# Patient Record
Sex: Female | Born: 1960 | Race: White | Hispanic: No | Marital: Married | Smoking: Former smoker
Health system: Southern US, Community
[De-identification: ages and names within clinical notes are randomized; demographics above are authoritative.]

## PROBLEM LIST (undated history)

## (undated) DIAGNOSIS — F32A Depression, unspecified: Secondary | ICD-10-CM

## (undated) DIAGNOSIS — R011 Cardiac murmur, unspecified: Secondary | ICD-10-CM

## (undated) DIAGNOSIS — F329 Major depressive disorder, single episode, unspecified: Secondary | ICD-10-CM

## (undated) DIAGNOSIS — C439 Malignant melanoma of skin, unspecified: Secondary | ICD-10-CM

## (undated) DIAGNOSIS — R7989 Other specified abnormal findings of blood chemistry: Secondary | ICD-10-CM

## (undated) DIAGNOSIS — C44711 Basal cell carcinoma of skin of unspecified lower limb, including hip: Secondary | ICD-10-CM

## (undated) DIAGNOSIS — K219 Gastro-esophageal reflux disease without esophagitis: Secondary | ICD-10-CM

## (undated) DIAGNOSIS — I10 Essential (primary) hypertension: Secondary | ICD-10-CM

## (undated) HISTORY — DX: Malignant melanoma of skin, unspecified: C43.9

## (undated) HISTORY — DX: Other specified abnormal findings of blood chemistry: R79.89

## (undated) HISTORY — DX: Gastro-esophageal reflux disease without esophagitis: K21.9

## (undated) HISTORY — DX: Depression, unspecified: F32.A

## (undated) HISTORY — PX: SKIN CANCER EXCISION: SHX779

## (undated) HISTORY — DX: Basal cell carcinoma of skin of unspecified lower limb, including hip: C44.711

## (undated) HISTORY — PX: FOOT SURGERY: SHX648

## (undated) HISTORY — DX: Major depressive disorder, single episode, unspecified: F32.9

## (undated) HISTORY — DX: Cardiac murmur, unspecified: R01.1

## (undated) HISTORY — PX: TONSILLECTOMY: SUR1361

## (undated) HISTORY — DX: Essential (primary) hypertension: I10

---

## 1994-09-27 DIAGNOSIS — C439 Malignant melanoma of skin, unspecified: Secondary | ICD-10-CM

## 1994-09-27 HISTORY — DX: Malignant melanoma of skin, unspecified: C43.9

## 2005-09-27 HISTORY — PX: ABDOMINAL HYSTERECTOMY: SHX81

## 2008-09-06 LAB — LIPID PANEL
Cholesterol: 236 mg/dL — AB (ref 0–200)
HDL: 58 mg/dL (ref 35–70)
LDL Cholesterol: 160 mg/dL
Triglycerides: 88 mg/dL (ref 40–160)

## 2008-09-06 LAB — TSH: TSH: 4.21 u[IU]/mL (ref 0.41–5.90)

## 2008-09-27 LAB — HM PAP SMEAR: HM Pap smear: NORMAL

## 2010-06-08 ENCOUNTER — Encounter
Admission: RE | Admit: 2010-06-08 | Discharge: 2010-06-08 | Payer: Self-pay | Source: Home / Self Care | Admitting: Pediatrics

## 2010-10-27 ENCOUNTER — Encounter (HOSPITAL_COMMUNITY)
Admission: RE | Admit: 2010-10-27 | Discharge: 2010-10-27 | Payer: Self-pay | Source: Home / Self Care | Attending: Podiatry | Admitting: Podiatry

## 2011-05-07 ENCOUNTER — Other Ambulatory Visit: Payer: Self-pay | Admitting: Pediatrics

## 2011-05-07 DIAGNOSIS — Z1231 Encounter for screening mammogram for malignant neoplasm of breast: Secondary | ICD-10-CM

## 2011-06-03 ENCOUNTER — Telehealth: Payer: Self-pay | Admitting: *Deleted

## 2011-06-03 DIAGNOSIS — Z0000 Encounter for general adult medical examination without abnormal findings: Secondary | ICD-10-CM

## 2011-06-03 NOTE — Telephone Encounter (Signed)
Received staff msg pt coming in for CPX 09/07/11. Need labs entered in computer. Entered orders in EPIC...06/03/11@2 :31pm/LMB

## 2011-06-15 ENCOUNTER — Ambulatory Visit: Payer: Self-pay

## 2011-07-02 ENCOUNTER — Ambulatory Visit: Payer: Self-pay

## 2011-09-03 ENCOUNTER — Other Ambulatory Visit (INDEPENDENT_AMBULATORY_CARE_PROVIDER_SITE_OTHER): Payer: BC Managed Care – PPO

## 2011-09-03 ENCOUNTER — Other Ambulatory Visit: Payer: Self-pay | Admitting: Internal Medicine

## 2011-09-03 DIAGNOSIS — Z0000 Encounter for general adult medical examination without abnormal findings: Secondary | ICD-10-CM

## 2011-09-03 LAB — URINALYSIS, ROUTINE W REFLEX MICROSCOPIC
Bilirubin Urine: NEGATIVE
Ketones, ur: NEGATIVE
Leukocytes, UA: NEGATIVE
Nitrite: NEGATIVE
Specific Gravity, Urine: <=1.005 (ref 1.000–1.030)
Total Protein, Urine: NEGATIVE
pH: 7 (ref 5.0–8.0)

## 2011-09-03 LAB — CBC WITH DIFFERENTIAL/PLATELET
Eosinophils Relative: 2.6 % (ref 0.0–5.0)
HCT: 41 % (ref 36.0–46.0)
Hemoglobin: 14.3 g/dL (ref 12.0–15.0)
Lymphocytes Relative: 24 % (ref 12.0–46.0)
Lymphs Abs: 1.3 10*3/uL (ref 0.7–4.0)
Monocytes Relative: 9.9 % (ref 3.0–12.0)
Neutro Abs: 3.3 10*3/uL (ref 1.4–7.7)
RBC: 4.34 Mil/uL (ref 3.87–5.11)
WBC: 5.3 10*3/uL (ref 4.5–10.5)

## 2011-09-03 LAB — BASIC METABOLIC PANEL
Calcium: 8.9 mg/dL (ref 8.4–10.5)
GFR: 68.89 mL/min (ref >60.00)
Glucose, Bld: 89 mg/dL (ref 70–99)
Potassium: 4 mEq/L (ref 3.5–5.1)
Sodium: 139 mEq/L (ref 135–145)

## 2011-09-03 LAB — HEPATIC FUNCTION PANEL
AST: 31 U/L (ref 0–37)
Albumin: 4.2 g/dL (ref 3.5–5.2)
Alkaline Phosphatase: 44 U/L (ref 39–117)
Bilirubin, Direct: 0.1 mg/dL (ref 0.0–0.3)

## 2011-09-03 LAB — LIPID PANEL
Total CHOL/HDL Ratio: 4
VLDL: 16.4 mg/dL (ref 0.0–40.0)

## 2011-09-03 LAB — TSH: TSH: 2.94 u[IU]/mL (ref 0.35–5.50)

## 2011-09-07 ENCOUNTER — Encounter: Payer: Self-pay | Admitting: Internal Medicine

## 2011-09-07 ENCOUNTER — Ambulatory Visit (INDEPENDENT_AMBULATORY_CARE_PROVIDER_SITE_OTHER): Payer: BC Managed Care – PPO | Admitting: Internal Medicine

## 2011-09-07 VITALS — BP 120/82 | HR 60 | Temp 98.6°F | Ht 69.5 in | Wt 208.6 lb

## 2011-09-07 DIAGNOSIS — Z0000 Encounter for general adult medical examination without abnormal findings: Secondary | ICD-10-CM

## 2011-09-07 DIAGNOSIS — Z1239 Encounter for other screening for malignant neoplasm of breast: Secondary | ICD-10-CM

## 2011-09-07 DIAGNOSIS — I10 Essential (primary) hypertension: Secondary | ICD-10-CM | POA: Insufficient documentation

## 2011-09-07 DIAGNOSIS — C439 Malignant melanoma of skin, unspecified: Secondary | ICD-10-CM | POA: Insufficient documentation

## 2011-09-07 DIAGNOSIS — F32A Depression, unspecified: Secondary | ICD-10-CM | POA: Insufficient documentation

## 2011-09-07 DIAGNOSIS — Z124 Encounter for screening for malignant neoplasm of cervix: Secondary | ICD-10-CM

## 2011-09-07 DIAGNOSIS — F3289 Other specified depressive episodes: Secondary | ICD-10-CM

## 2011-09-07 DIAGNOSIS — F329 Major depressive disorder, single episode, unspecified: Secondary | ICD-10-CM

## 2011-09-07 DIAGNOSIS — M542 Cervicalgia: Secondary | ICD-10-CM

## 2011-09-07 MED ORDER — HYDROCHLOROTHIAZIDE 25 MG PO TABS: 25.0000 mg | ORAL_TABLET | Freq: Every day | ORAL | Status: DC

## 2011-09-07 MED ORDER — DULOXETINE HCL 30 MG PO CPEP: 30.0000 mg | ORAL_CAPSULE | Freq: Every day | ORAL | Status: DC

## 2011-09-07 NOTE — Patient Instructions (Signed)
It was good to see you today. Health Maintenance reviewed - mammogram and gynecology referrals done; immunizations up to date we'll make referral to mammogram, gynecology, and to physical therapy for your neck/left shoulder. Our office will contact you regarding appointment(s) once made. Medications reviewed, no changes at this time. Refill on medication(s) as discussed today. Work on lifestyle changes as discussed (low fat, low carb, increased protein diet; improved exercise efforts; weight loss) to control sugar, blood pressure and cholesterol levels and/or reduce risk of developing other medical problems. Look into [email or web address] or other type of food journal to assist you in this process. Please schedule followup in 3-4 months to review depression medications and med plans - also weight check, call sooner if problems.

## 2011-09-07 NOTE — Progress Notes (Signed)
Subjective:    Patient ID: Caroline Wall, female    DOB: March 15, 1961, 50 y.o.   MRN: [id number]  HPI New patient to me and our practice, here today to establish care Also here for annual physical. Overall feels well  Also reviewed chronic medical issues today: Depression. On various medical treatments for same over past 10 years. Initial episode precipitated by "bad breakup" -has tried sertraline and Wellbutrin in past with ineffective results. On Cymbalta since 2009 with good control but would like to consider weaning off same. Not currently in counseling but has completed work on same in past. Denies SI/HI or current symptoms of insomnia, fatigue or depression  Melanoma. Personal history of same in 1996 located on left low back. Status post wide excision with no evidence of recurrence. Follows annually at [facility] for a dermatology review and body mapping. Follows with Dr. Maisie Fus for same  Hypertension. On diuretic therapy for same for years. the patient reports compliance with medication(s) as prescribed. Denies adverse side effects.  complains of left neck and shoulder pain - Onset 2-1/2 weeks ago -  precipitated by "sleeping wrong", no injury.  Pain some improved with ibuprofen and massage.  Not associated with numbness, weakness or balance problems   Past Medical History  Diagnosis Date  . Depression     prior tx failure with sertraline and wellbutrin  . Hypertension   . Melanoma 1996    L low back - followxs annually for body scan at [facility]   Family History  Problem Relation Age of Onset  . Endometrial cancer Mother 46  . Hypertension Father   . Diabetes Paternal Grandmother   . Heart disease Paternal Grandmother 22   History  Substance Use Topics  . Smoking status: Former Games developer  . Smokeless tobacco: Not on file   Comment: Lives with domestic partner and their 2 adopted children  . Alcohol Use: Yes    Review of Systems Constitutional: Negative for  fever; positive for gradual weight gain -approximately 20 pounds over past year (relates to decreased activity from plantar fasciitis pain and surgery in 2011)  Respiratory: Negative for cough and shortness of breath.   Cardiovascular: Negative for chest pain or palpitations.  Gastrointestinal: Negative for abdominal pain, no bowel changes.  Musculoskeletal: Negative for gait problem or joint swelling.  see history of present illness above Skin: Negative for rash.  Neurological: Negative for dizziness or headache.  No other specific complaints in a complete review of systems (except as listed in HPI above).     Objective:   Physical Exam BP 120/82  Pulse 60  Temp(Src) 98.6 F (37 C) (Oral)  Ht 5' 9.5" (1.765 m)  Wt 208 lb 9.6 oz (94.62 kg)  BMI 30.36 kg/m2  SpO2 97% Wt Readings from Last 3 Encounters:  09/07/11 208 lb 9.6 oz (94.62 kg)   Constitutional: She is overweight but appears well-developed and well-nourished. No distress.  HENT: Head: Normocephalic and atraumatic. Ears: B TMs ok, no erythema or effusion; Nose: Nose normal. Mouth/Throat: Oropharynx is clear and moist. No oropharyngeal exudate.  Eyes: Wears corrective lenses. Conjunctivae and EOM are normal. Pupils are equal, round, and reactive to light. No scleral icterus.  Neck: Normal range of motion. Neck supple. No JVD present. No thyromegaly present.  Cardiovascular: Normal rate, regular rhythm and normal heart sounds.  No murmur heard. No BLE edema. Pulmonary/Chest: Effort normal and breath sounds normal. No respiratory distress. She has no wheezes.  Abdominal: Soft. Bowel sounds  are normal. She exhibits no distension. There is no tenderness. no masses Musculoskeletal: Normal range of motion, no joint effusions. No gross deformities. Normal left shoulder range of motion and neck supple but myofascial tightness over left cervical region. Equal and symmetric grip bilaterally GU: Defer to GYN  Neurological: She is alert and  oriented to person, place, and time. No cranial nerve deficit. Coordination normal.  Skin: Skin is warm and dry. No rash noted. No erythema.  Psychiatric: She has a normal mood and affect. Her behavior is normal. Judgment and thought content normal.   Lab Results  Component Value Date   WBC 5.3 09/03/2011   HGB 14.3 09/03/2011   HCT 41.0 09/03/2011   PLT 224.0 09/03/2011   GLUCOSE 89 09/03/2011   CHOL 221* 09/03/2011   TRIG 82.0 09/03/2011   HDL 56.70 09/03/2011   LDLDIRECT 148.1 09/03/2011   ALT 36* 09/03/2011   AST 31 09/03/2011   NA 139 09/03/2011   K 4.0 09/03/2011   CL 104 09/03/2011   CREATININE 0.9 09/03/2011   BUN 14 09/03/2011   CO2 27 09/03/2011   TSH 2.94 09/03/2011       Assessment & Plan:  CPX - v70.0 - Patient has been counseled on age-appropriate routine health concerns for screening and prevention. These are reviewed and up-to-date. Immunizations are up-to-date or declined. Labs reviewed. Refer for mammogram and GYN pelvic exam. Send for records from prior PCP for immunization review  Left shoulder and neck discomfort. Suspect mild cervical DDD with impingement. Continue anti-inflammatories, add muscle relaxants and referred to physical therapy. Reassurance provided

## 2011-09-07 NOTE — Assessment & Plan Note (Signed)
BP Readings from Last 3 Encounters:  09/07/11 120/82   The current medical regimen is effective;  continue present plan and medications.

## 2011-09-07 NOTE — Assessment & Plan Note (Signed)
On various SSRIs including sertraline and Wellbutrin since 2006 On Cymbalta since 2009 with good control, but would like to wean off same Advise waiting until spring to make medication changes to favor better outcome Previously in counseling at this time no active symptoms Patient will schedule followup in 3-4 months to revisit this issue and wean off medications if symptoms still well controlled

## 2011-09-29 ENCOUNTER — Ambulatory Visit
Admission: RE | Admit: 2011-09-29 | Discharge: 2011-09-29 | Disposition: A | Discharge status: Home / Self Care | Payer: BC Managed Care – PPO | Source: Ambulatory Visit | Attending: Internal Medicine | Admitting: Internal Medicine

## 2011-09-29 ENCOUNTER — Ambulatory Visit: Payer: BC Managed Care – PPO

## 2011-09-29 DIAGNOSIS — Z1239 Encounter for other screening for malignant neoplasm of breast: Secondary | ICD-10-CM

## 2011-10-28 ENCOUNTER — Encounter: Payer: Self-pay | Admitting: Internal Medicine

## 2012-01-05 ENCOUNTER — Ambulatory Visit: Payer: BC Managed Care – PPO | Admitting: Internal Medicine

## 2012-01-11 ENCOUNTER — Other Ambulatory Visit: Payer: Self-pay | Admitting: *Deleted

## 2012-01-11 MED ORDER — HYDROCHLOROTHIAZIDE 25 MG PO TABS: 25.0000 mg | ORAL_TABLET | Freq: Every day | ORAL | Status: DC

## 2012-01-11 NOTE — Telephone Encounter (Signed)
Not sure which [facility], but fax back to [phone]... 01/11/12@8 :59am/LMB

## 2012-01-31 ENCOUNTER — Ambulatory Visit: Payer: BC Managed Care – PPO | Admitting: Internal Medicine

## 2012-03-16 ENCOUNTER — Encounter: Payer: Self-pay | Admitting: Internal Medicine

## 2012-03-16 ENCOUNTER — Ambulatory Visit (INDEPENDENT_AMBULATORY_CARE_PROVIDER_SITE_OTHER): Payer: BC Managed Care – PPO | Admitting: Internal Medicine

## 2012-03-16 VITALS — BP 120/70 | HR 59 | Temp 98.1°F | Ht 69.5 in | Wt 205.1 lb

## 2012-03-16 DIAGNOSIS — F32A Depression, unspecified: Secondary | ICD-10-CM

## 2012-03-16 DIAGNOSIS — F3289 Other specified depressive episodes: Secondary | ICD-10-CM

## 2012-03-16 DIAGNOSIS — F329 Major depressive disorder, single episode, unspecified: Secondary | ICD-10-CM

## 2012-03-16 DIAGNOSIS — I10 Essential (primary) hypertension: Secondary | ICD-10-CM

## 2012-03-16 NOTE — Assessment & Plan Note (Signed)
BP Readings from Last 3 Encounters:  03/16/12 120/70  09/07/11 120/82   The current medical regimen is effective;  continue present plan and medications.

## 2012-03-16 NOTE — Progress Notes (Signed)
  Subjective:    Patient ID: Caroline Wall, female    DOB: 1961/04/26, 51 y.o.   MRN: [id number]  HPI  Here for follow up - reviewed chronic medical issues today:  Depression. On various medical treatments for same over past 10 years. Initial episode precipitated by "bad breakup" -has tried sertraline and Wellbutrin in past with ineffective results. On Cymbalta since 2009 with good control but would like to consider weaning off same. Not currently in counseling but has completed work on same in past. Denies SI/HI or current symptoms of insomnia, fatigue or depression  Melanoma. Personal history of same in 1996 located on left low back. Status post wide excision with no evidence of recurrence. Follows annually at [facility] for a dermatology review and body mapping. Follows with Dr. Maisie Fus for same  Hypertension. On diuretic therapy for same for years. the patient reports compliance with medication(s) as prescribed. Denies adverse side effects.   Past Medical History  Diagnosis Date  . Depression     prior tx failure with sertraline and wellbutrin  . Hypertension   . Melanoma 1996    L low back - followxs annually for body scan at [facility]    Review of Systems  Constitutional: Negative for fever or unexpected weight gain  Respiratory: Negative for cough and shortness of breath.   Cardiovascular: Negative for chest pain or palpitations.      Objective:   Physical Exam  BP 120/70  Pulse 59  Temp 98.1 F (36.7 C) (Oral)  Ht 5' 9.5" (1.765 m)  Wt 205 lb 1.9 oz (93.042 kg)  BMI 29.86 kg/m2  SpO2 98% Wt Readings from Last 3 Encounters:  03/16/12 205 lb 1.9 oz (93.042 kg)  09/07/11 208 lb 9.6 oz (94.62 kg)   Constitutional: She is overweight but appears well-developed and well-nourished. No distress.  Neck: Normal range of motion. Neck supple. No JVD present. No thyromegaly present.  Cardiovascular: Normal rate, regular rhythm and normal heart sounds.  No murmur heard.  No BLE edema. Pulmonary/Chest: Effort normal and breath sounds normal. No respiratory distress. She has no wheezes.   Psychiatric: She has a normal mood and affect. Her behavior is normal. Judgment and thought content normal.   Lab Results  Component Value Date   WBC 5.3 09/03/2011   HGB 14.3 09/03/2011   HCT 41.0 09/03/2011   PLT 224.0 09/03/2011   GLUCOSE 89 09/03/2011   CHOL 221* 09/03/2011   TRIG 82.0 09/03/2011   HDL 56.70 09/03/2011   LDLDIRECT 148.1 09/03/2011   LDLCALC 160 09/06/2008   ALT 36* 09/03/2011   AST 31 09/03/2011   NA 139 09/03/2011   K 4.0 09/03/2011   CL 104 09/03/2011   CREATININE 0.9 09/03/2011   BUN 14 09/03/2011   CO2 27 09/03/2011   TSH 2.94 09/03/2011       Assessment & Plan:  See problem list. Medications and labs reviewed today.

## 2012-03-16 NOTE — Assessment & Plan Note (Signed)
On various SSRIs including sertraline and Wellbutrin since 2006 On Cymbalta since 2009 with good control, but would like to wean off same Change to qod x 6 weeks, then stop Previously in counseling, but not needed at this time

## 2012-03-16 NOTE — Patient Instructions (Signed)
It was good to see you today. We have reviewed your prior records including labs and tests today Medications reviewed, wean cymbalta as discussed Continue to work on lifestyle changes as ongoing (low fat, low carb, increased protein diet; improved exercise efforts; weight loss) to control sugar, blood pressure and cholesterol levels and/or reduce risk of developing other medical problems.  Please schedule followup in 6 months for physical and labs - also weight check, call sooner if problems.

## 2012-05-03 ENCOUNTER — Telehealth: Payer: Self-pay | Admitting: Internal Medicine

## 2012-05-03 NOTE — Telephone Encounter (Signed)
Noted and agree with advice - needs to see gyn - thanks

## 2012-05-03 NOTE — Telephone Encounter (Signed)
Pt started having vaginal bleeding like a period yesterday.  She has had a hysterectomy.  She went to Dr. Juliene Pina over a year ago.  She is going to call that office to see if she can get an appt.

## 2012-05-04 ENCOUNTER — Other Ambulatory Visit: Payer: Self-pay | Admitting: Internal Medicine

## 2012-05-05 ENCOUNTER — Telehealth: Payer: Self-pay | Admitting: *Deleted

## 2012-05-05 DIAGNOSIS — K625 Hemorrhage of anus and rectum: Secondary | ICD-10-CM

## 2012-05-05 NOTE — Telephone Encounter (Signed)
Ok - done. i presume rectal bleeding given last phone note "bleeding like a period" and planning gyn eval for same 48h ago.Caroline KitchenMarland Wall

## 2012-05-05 NOTE — Telephone Encounter (Signed)
Left msg on vm Thursday afternoon wanting to get a referral to see GI md... 05/05/12@8 :55am/LMB

## 2012-05-05 NOTE — Telephone Encounter (Signed)
Called pt no answer LMOM md ok referral will received call back from [facility] once appt has been arrange with appt, date, and time... 05/05/12@2 :43pm/LMB

## 2012-05-09 ENCOUNTER — Encounter: Payer: Self-pay | Admitting: Gastroenterology

## 2012-05-17 ENCOUNTER — Encounter: Payer: Self-pay | Admitting: *Deleted

## 2012-05-18 ENCOUNTER — Ambulatory Visit (INDEPENDENT_AMBULATORY_CARE_PROVIDER_SITE_OTHER): Payer: BC Managed Care – PPO | Admitting: Gastroenterology

## 2012-05-18 ENCOUNTER — Encounter: Payer: Self-pay | Admitting: Gastroenterology

## 2012-05-18 VITALS — BP 110/80 | HR 60 | Ht 69.0 in | Wt 201.2 lb

## 2012-05-18 DIAGNOSIS — K648 Other hemorrhoids: Secondary | ICD-10-CM

## 2012-05-18 DIAGNOSIS — K625 Hemorrhage of anus and rectum: Secondary | ICD-10-CM

## 2012-05-18 MED ORDER — MOVIPREP 100 G PO SOLR: 1.0000 | Freq: Once | ORAL | Status: DC

## 2012-05-18 MED ORDER — HYDROCORTISONE ACETATE 25 MG RE SUPP: 25.0000 mg | Freq: Two times a day (BID) | RECTAL | Status: DC

## 2012-05-18 NOTE — Patient Instructions (Addendum)
You have been scheduled for a colonoscopy with propofol. Please follow written instructions given to you at your visit today.  Please pick up your prep kit at the pharmacy within the next 1-3 days. If you use inhalers (even only as needed), please bring them with you on the day of your procedure. We have sent the following medications to your pharmacy for you to pick up at your convenience: Anusol suppositories. cc: Rene Paci, MD

## 2012-05-18 NOTE — Progress Notes (Signed)
History of Present Illness:  This is a very nice 51 year old Caucasian female who has had one day of asymptomatic rectal bleeding on August 6 after a prolonged airline flight. She denies abdominal rectal pain or history of rectal bleeding. Actually she had a negative colonoscopy in 2007  at [facility] before a planned hysterectomy for uterine fibroid tumors. . We do not have that report for review. There is no family history of colon polyps or cancer. Recent gynecologic exam is entirely normal. She follows a regular diet denies any specific food intolerances. Has had previous history of resection for melanoma in 1996 with yearly followup at [facility].  I have reviewed this patient's present history, medical and surgical past history, allergies and medications.     ROS: The remainder of the 10 point ROS is negative.Marland Kitchen     Physical Exam: Blood pressure 110/80, pulse 60 and regular, and weight 201 pounds the BMI of 29.71. General well developed well nourished patient in no acute distress, appearing their stated age Eyes PERRLA, no icterus, fundoscopic exam per opthamologist Skin no lesions noted Neck supple, no adenopathy, no thyroid enlargement, no tenderness Chest clear to percussion and auscultation Heart no significant murmurs, gallops or rubs noted Abdomen no hepatosplenomegaly masses or tenderness, BS normal.  Rectal inspection normal no fissures, or fistulae noted.  No masses or tenderness on digital exam. Stool guaiac negative. Extremities no acute joint lesions, edema, phlebitis or evidence of cellulitis. Neurologic patient oriented x 3, cranial nerves intact, no focal neurologic deficits noted. Psychological mental status normal and normal affect. ANOSCOPY: There appears to be a slightly inflamed posterior lateral internal hemorrhoid, but anoscopic exam otherwise unremarkable. There is no evidence of fissuring, or mucosal changes, or active bleeding.  Assessment and plan: Recent bleeding from  internal hemorrhoids. This was associated with horseback riding, and a prolonged airline flight from [place]. I have placed her on when necessary Anusol-HC suppositories, reviewed hemorrhoids and their management with the patient, and will complete followup colonoscopy in the next several weeks.  No diagnosis found.

## 2012-05-19 ENCOUNTER — Encounter: Payer: Self-pay | Admitting: Gastroenterology

## 2012-06-02 ENCOUNTER — Encounter: Payer: BC Managed Care – PPO | Admitting: Gastroenterology

## 2012-06-05 ENCOUNTER — Ambulatory Visit (AMBULATORY_SURGERY_CENTER): Payer: BC Managed Care – PPO | Admitting: Gastroenterology

## 2012-06-05 ENCOUNTER — Encounter: Payer: Self-pay | Admitting: Gastroenterology

## 2012-06-05 VITALS — BP 130/68 | HR 45 | Temp 97.8°F | Resp 18 | Ht 69.0 in | Wt 201.0 lb

## 2012-06-05 DIAGNOSIS — D126 Benign neoplasm of colon, unspecified: Secondary | ICD-10-CM

## 2012-06-05 DIAGNOSIS — K625 Hemorrhage of anus and rectum: Secondary | ICD-10-CM

## 2012-06-05 DIAGNOSIS — Z1211 Encounter for screening for malignant neoplasm of colon: Secondary | ICD-10-CM

## 2012-06-05 DIAGNOSIS — K648 Other hemorrhoids: Secondary | ICD-10-CM

## 2012-06-05 MED ORDER — SODIUM CHLORIDE 0.9 % IV SOLN: 500.0000 mL | INTRAVENOUS | Status: DC

## 2012-06-05 NOTE — Op Note (Signed)
 [facility] [address] [place], [address]   COLONOSCOPY PROCEDURE REPORT  PATIENT: Caroline Wall, Caroline Wall  MR#: [id number] BIRTHDATE: 27-Aug-1961 , 51  yrs. old GENDER: Female ENDOSCOPIST: Mardella Layman, MD, [facility] REFERRED BY: PROCEDURE DATE:  06/05/2012 PROCEDURE:   Colonoscopy with biopsy ASA CLASS:   Class II INDICATIONS:average risk patient for colon cancer and rectal bleeding. MEDICATIONS: Propofol (Diprivan) 460 mg IV  DESCRIPTION OF PROCEDURE:   After the risks and benefits and of the procedure were explained, informed consent was obtained.  A digital rectal exam revealed no abnormalities of the rectum.    The LB [id number]  endoscope was introduced through the anus and advanced to the cecum, which was identified by both the appendix and ileocecal valve .  The quality of the prep was excellent, using MoviPrep .  The instrument was then slowly withdrawn as the colon was fully examined.     COLON FINDINGS: The colon was redundant.  Manual abdominal counter-pressure was used to reach the cecum.   The colon was otherwise normal.  There was no diverticulosis, inflamation, polyps or cancers unless previously stated.   A small smooth flat polyp was found at the cecum.  A biopsy of the area was performed. Very difficult exam per tortuous colon....    Retroflexed views revealed no abnormalities.     The scope was then withdrawn from the patient and the procedure completed.  COMPLICATIONS: There were no complications. ENDOSCOPIC IMPRESSION: 1.   The colon was redundant 2.   The colon was otherwise normal ..no large hemorrhoids noted. 3.   Small flat polyp was found at the cecum; biopsy of the area was performed  RECOMMENDATIONS: 1.  await biopsy results 2.  f/u 3 years per difficult exam and difficult to view the cecum.   REPEAT EXAM:  cc:  _______________________________ eSignedMardella Layman, MD, [facility] 06/05/2012 12:04 PM

## 2012-06-05 NOTE — Patient Instructions (Addendum)

## 2012-06-05 NOTE — Progress Notes (Signed)
Patient did not experience any of the following events: a burn prior to discharge; a fall within the facility; wrong site/side/patient/procedure/implant event; or a hospital transfer or hospital admission upon discharge from the facility. (G8907) Patient did not have preoperative order for IV antibiotic SSI prophylaxis. (G8918)  

## 2012-06-06 ENCOUNTER — Telehealth: Payer: Self-pay

## 2012-06-06 NOTE — Telephone Encounter (Signed)
  Follow up Call-  Call back number 06/05/2012  Post procedure Call Back phone  # [phone]  Permission to leave phone message Yes     Patient questions:  Do you have a fever, pain , or abdominal swelling? no Pain Score  0 *  Have you tolerated food without any problems? yes  Have you been able to return to your normal activities? yes  Do you have any questions about your discharge instructions: Diet   no Medications  no Follow up visit  no  Do you have questions or concerns about your Care? no  Actions: * If pain score is 4 or above: No action needed, pain <4.

## 2012-06-09 ENCOUNTER — Encounter: Payer: Self-pay | Admitting: Gastroenterology

## 2012-07-12 ENCOUNTER — Other Ambulatory Visit: Payer: Self-pay | Admitting: Internal Medicine

## 2012-08-23 ENCOUNTER — Other Ambulatory Visit: Payer: Self-pay | Admitting: Internal Medicine

## 2012-08-23 DIAGNOSIS — Z1231 Encounter for screening mammogram for malignant neoplasm of breast: Secondary | ICD-10-CM

## 2012-09-14 ENCOUNTER — Ambulatory Visit: Payer: BC Managed Care – PPO | Admitting: Internal Medicine

## 2012-09-18 ENCOUNTER — Encounter: Payer: Self-pay | Admitting: Internal Medicine

## 2012-09-18 ENCOUNTER — Other Ambulatory Visit (INDEPENDENT_AMBULATORY_CARE_PROVIDER_SITE_OTHER): Payer: BC Managed Care – PPO

## 2012-09-18 ENCOUNTER — Ambulatory Visit (INDEPENDENT_AMBULATORY_CARE_PROVIDER_SITE_OTHER): Payer: BC Managed Care – PPO | Admitting: Internal Medicine

## 2012-09-18 VITALS — BP 132/88 | HR 68 | Temp 98.0°F | Ht 69.5 in | Wt 207.4 lb

## 2012-09-18 DIAGNOSIS — Z0000 Encounter for general adult medical examination without abnormal findings: Secondary | ICD-10-CM

## 2012-09-18 DIAGNOSIS — I10 Essential (primary) hypertension: Secondary | ICD-10-CM

## 2012-09-18 LAB — CBC WITH DIFFERENTIAL/PLATELET
Basophils Relative: 0.6 % (ref 0.0–3.0)
Eosinophils Absolute: 0.2 10*3/uL (ref 0.0–0.7)
Eosinophils Relative: 3.5 % (ref 0.0–5.0)
Hemoglobin: 13.6 g/dL (ref 12.0–15.0)
Lymphocytes Relative: 28 % (ref 12.0–46.0)
MCHC: 35 g/dL (ref 30.0–36.0)
Neutro Abs: 3 10*3/uL (ref 1.4–7.7)
RBC: 4.25 Mil/uL (ref 3.87–5.11)
WBC: 5.1 10*3/uL (ref 4.5–10.5)

## 2012-09-18 LAB — URINALYSIS, ROUTINE W REFLEX MICROSCOPIC
Bilirubin Urine: NEGATIVE
Hgb urine dipstick: NEGATIVE
Leukocytes, UA: NEGATIVE
Nitrite: NEGATIVE
Urobilinogen, UA: 0.2 (ref 0.0–1.0)

## 2012-09-18 LAB — BASIC METABOLIC PANEL
CO2: 26 mEq/L (ref 19–32)
Calcium: 9 mg/dL (ref 8.4–10.5)
Chloride: 102 mEq/L (ref 96–112)
Sodium: 136 mEq/L (ref 135–145)

## 2012-09-18 LAB — TSH: TSH: 2.55 u[IU]/mL (ref 0.35–5.50)

## 2012-09-18 LAB — LIPID PANEL
HDL: 44.9 mg/dL (ref >39.00)
LDL Cholesterol: 117 mg/dL — ABNORMAL HIGH (ref 0–99)
Total CHOL/HDL Ratio: 4
Triglycerides: 75 mg/dL (ref 0.0–149.0)

## 2012-09-18 LAB — HEPATIC FUNCTION PANEL
AST: 20 U/L (ref 0–37)
Albumin: 3.9 g/dL (ref 3.5–5.2)

## 2012-09-18 NOTE — Patient Instructions (Signed)
It was good to see you today. Health Maintenance reviewed - consider follow up with gynecology in next 12 months; immunizations and other age appropriate screening up to date Test(s) ordered today. Your results will be released to MyChart (or called to you) after review, usually within 72hours after test completion. If any changes need to be made, you will be notified at that same time. Medications reviewed, no changes at this time. Refill on medication(s) as discussed today. Work on lifestyle changes as discussed (low fat, low carb, increased protein diet; improved exercise efforts; weight loss) to control sugar, blood pressure and cholesterol levels and/or reduce risk of developing other medical problems. Look into [email or web address] or other type of food journal to assist you in this process. Please schedule followup in 12 months to review weight and for physical/labs - call sooner if problems Health Maintenance, Females A healthy lifestyle and preventative care can promote health and wellness.  Maintain regular health, dental, and eye exams.   Eat a healthy diet. Foods like vegetables, fruits, whole grains, low-fat dairy products, and lean protein foods contain the nutrients you need without too many calories. Decrease your intake of foods high in solid fats, added sugars, and salt. Get information about a proper diet from your caregiver, if necessary.   Regular physical exercise is one of the most important things you can do for your health. Most adults should get at least 150 minutes of moderate-intensity exercise (any activity that increases your heart rate and causes you to sweat) each week. In addition, most adults need muscle-strengthening exercises on 2 or more days a week.     Maintain a healthy weight. The body mass index (BMI) is a screening tool to identify possible weight problems. It provides an estimate of body fat based on height and weight. Your caregiver can help determine your  BMI, and can help you achieve or maintain a healthy weight. For adults 20 years and older:   A BMI below 18.5 is considered underweight.   A BMI of 18.5 to 24.9 is normal.   A BMI of 25 to 29.9 is considered overweight.   A BMI of 30 and above is considered obese.   Maintain normal blood lipids and cholesterol by exercising and minimizing your intake of saturated fat. Eat a balanced diet with plenty of fruits and vegetables. Blood tests for lipids and cholesterol should begin at age 55 and be repeated every 5 years. If your lipid or cholesterol levels are high, you are over 50, or you are a high risk for heart disease, you may need your cholesterol levels checked more frequently. Ongoing high lipid and cholesterol levels should be treated with medicines if diet and exercise are not effective.   If you smoke, find out from your caregiver how to quit. If you do not use tobacco, do not start.   If you are pregnant, do not drink alcohol. If you are breastfeeding, be very cautious about drinking alcohol. If you are not pregnant and choose to drink alcohol, do not exceed 1 drink per day. One drink is considered to be 12 ounces (355 mL) of beer, 5 ounces (148 mL) of wine, or 1.5 ounces (44 mL) of liquor.   Avoid use of street drugs. Do not share needles with anyone. Ask for help if you need support or instructions about stopping the use of drugs.   High blood pressure causes heart disease and increases the risk of stroke. Blood pressure should be checked at least  every 1 to 2 years. Ongoing high blood pressure should be treated with medicines, if weight loss and exercise are not effective.   If you are 97 to 50 years old, ask your caregiver if you should take aspirin to prevent strokes.   Diabetes screening involves taking a blood sample to check your fasting blood sugar level. This should be done once every 3 years, after age 42, if you are within normal weight and without risk factors for diabetes.  Testing should be considered at a younger age or be carried out more frequently if you are overweight and have at least 1 risk factor for diabetes.   Breast cancer screening is essential preventative care for women. You should practice "breast self-awareness." This means understanding the normal appearance and feel of your breasts and may include breast self-examination. Any changes detected, no matter how small, should be reported to a caregiver. Women in their 64s and 30s should have a clinical breast exam (CBE) by a caregiver as part of a regular health exam every 1 to 3 years. After age 67, women should have a CBE every year. Starting at age 93, women should consider having a mammogram (breast X-ray) every year. Women who have a family history of breast cancer should talk to their caregiver about genetic screening. Women at a high risk of breast cancer should talk to their caregiver about having an MRI and a mammogram every year.   The Pap test is a screening test for cervical cancer. Women should have a Pap test starting at age 25. Between ages 71 and 34, Pap tests should be repeated every 2 years. Beginning at age 62, you should have a Pap test every 3 years as long as the past 3 Pap tests have been normal. If you had a hysterectomy for a problem that was not cancer or a condition that could lead to cancer, then you no longer need Pap tests. If you are between ages 71 and 74, and you have had normal Pap tests going back 10 years, you no longer need Pap tests. If you have had past treatment for cervical cancer or a condition that could lead to cancer, you need Pap tests and screening for cancer for at least 20 years after your treatment. If Pap tests have been discontinued, risk factors (such as a new sexual partner) need to be reassessed to determine if screening should be resumed. Some women have medical problems that increase the chance of getting cervical cancer. In these cases, your caregiver may  recommend more frequent screening and Pap tests.   The human papillomavirus (HPV) test is an additional test that may be used for cervical cancer screening. The HPV test looks for the virus that can cause the cell changes on the cervix. The cells collected during the Pap test can be tested for HPV. The HPV test could be used to screen women aged 15 years and older, and should be used in women of any age who have unclear Pap test results. After the age of 38, women should have HPV testing at the same frequency as a Pap test.   Colorectal cancer can be detected and often prevented. Most routine colorectal cancer screening begins at the age of 59 and continues through age 10. However, your caregiver may recommend screening at an earlier age if you have risk factors for colon cancer. On a yearly basis, your caregiver may provide home test kits to check for hidden blood in the stool. Use  of a small camera at the end of a tube, to directly examine the colon (sigmoidoscopy or colonoscopy), can detect the earliest forms of colorectal cancer. Talk to your caregiver about this at age 58, when routine screening begins. Direct examination of the colon should be repeated every 5 to 10 years through age 34, unless early forms of pre-cancerous polyps or small growths are found.   Hepatitis C blood testing is recommended for all people born from [birth year] through 1964 and any individual with known risks for hepatitis C.   Practice safe sex. Use condoms and avoid high-risk sexual practices to reduce the spread of sexually transmitted infections (STIs). Sexually active women aged 85 and younger should be checked for Chlamydia, which is a common sexually transmitted infection. Older women with new or multiple partners should also be tested for Chlamydia. Testing for other STIs is recommended if you are sexually active and at increased risk.   Osteoporosis is a disease in which the bones lose minerals and strength with aging. This  can result in serious bone fractures. The risk of osteoporosis can be identified using a bone density scan. Women ages 61 and over and women at risk for fractures or osteoporosis should discuss screening with their caregivers. Ask your caregiver whether you should be taking a calcium supplement or vitamin D to reduce the rate of osteoporosis.   Menopause can be associated with physical symptoms and risks. Hormone replacement therapy is available to decrease symptoms and risks. You should talk to your caregiver about whether hormone replacement therapy is right for you.   Use sunscreen with a sun protection factor (SPF) of 30 or greater. Apply sunscreen liberally and repeatedly throughout the day. You should seek shade when your shadow is shorter than you. Protect yourself by wearing long sleeves, pants, a wide-brimmed hat, and sunglasses year round, whenever you are outdoors.   Notify your caregiver of new moles or changes in moles, especially if there is a change in shape or color. Also notify your caregiver if a mole is larger than the size of a pencil eraser.   Stay current with your immunizations.  Document Released: 03/29/2011 Document Revised: 12/06/2011 Document Reviewed: 03/29/2011 [facility] Patient Information 2013 [place], [place].   Exercise to Lose Weight Exercise and a healthy diet may help you lose weight. Your doctor may suggest specific exercises. EXERCISE IDEAS AND TIPS  Choose low-cost things you enjoy doing, such as walking, bicycling, or exercising to workout videos.   Take stairs instead of the elevator.   Walk during your lunch break.   Park your car further away from work or school.   Go to a gym or an exercise class.   Start with 5 to 10 minutes of exercise each day. Build up to 30 minutes of exercise 4 to 6 days a week.   Wear shoes with good support and comfortable clothes.   Stretch before and after working out.   Work out until you breathe harder and your  heart beats faster.   Drink extra water when you exercise.   Do not do so much that you hurt yourself, feel dizzy, or get very short of breath.  Exercises that burn about 150 calories:  Running 1  miles in 15 minutes.   Playing volleyball for 45 to 60 minutes.   Washing and waxing a car for 45 to 60 minutes.   Playing touch football for 45 minutes.   Walking 1  miles in 35 minutes.   Pushing  a stroller 1  miles in 30 minutes.   Playing basketball for 30 minutes.   Raking leaves for 30 minutes.   Bicycling 5 miles in 30 minutes.   Walking 2 miles in 30 minutes.   Dancing for 30 minutes.   Shoveling snow for 15 minutes.   Swimming laps for 20 minutes.   Walking up stairs for 15 minutes.   Bicycling 4 miles in 15 minutes.   Gardening for 30 to 45 minutes.   Jumping rope for 15 minutes.   Washing windows or floors for 45 to 60 minutes.  Document Released: 10/16/2010 Document Revised: 12/06/2011 Document Reviewed: 10/16/2010 [facility] Patient Information 2013 [place], [place].

## 2012-09-18 NOTE — Assessment & Plan Note (Signed)
BP Readings from Last 3 Encounters:  09/18/12 132/88  06/05/12 130/68  05/18/12 110/80   The current medical regimen is generally effective;  continue present plan and medications.

## 2012-09-18 NOTE — Progress Notes (Signed)
Subjective:    Patient ID: Caroline Wall, female    DOB: 04-02-1961, 51 y.o.   MRN: [id number]  HPI  here for annual physical. Overall feels well  Also reviewed chronic medical issues today: Depression. On various medical treatments for same over past 10 years. Initial episode precipitated by "bad breakup" -has tried sertraline and Wellbutrin in past with ineffective results. On Cymbalta since 2009 thru 06/2012 with good control, weaned off same summer 2013. Not currently in counseling but has completed work on same in past. Denies SI/HI or current symptoms of insomnia, fatigue or depression  Melanoma. Personal history of same in 1996 located on left low back. Status post wide excision with no evidence of recurrence. Follows annually at [facility] for a dermatology review and body mapping. Follows with Dr. Maisie Fus for same  Hypertension. On diuretic therapy for same for years. the patient reports compliance with medication(s) as prescribed. Denies adverse side effects.   Past Medical History  Diagnosis Date  . Depression     prior tx failure with sertraline and wellbutrin  . Hypertension   . Melanoma 1996    L low back - follows annually for body scan at [facility]  . GERD (gastroesophageal reflux disease)   . Heart murmur    Family History  Problem Relation Age of Onset  . Endometrial cancer Mother 23  . Hypertension Father   . Clotting disorder Father   . Diabetes Paternal Grandmother   . Heart disease Paternal Grandmother 44  . Colon cancer Neg Hx   . Esophageal cancer Neg Hx   . Rectal cancer Neg Hx   . Stomach cancer Neg Hx    History  Substance Use Topics  . Smoking status: Former Games developer  . Smokeless tobacco: Never Used     Comment: Lives with domestic partner and their 2 adopted children  . Alcohol Use: 5.4 oz/week    9 Glasses of wine per week    Review of Systems  Constitutional: Negative for fever; positive for gradual weight gain -  Respiratory:  Negative for cough and shortness of breath.   Cardiovascular: Negative for chest pain or palpitations.  Gastrointestinal: Negative for abdominal pain, no bowel changes.  Musculoskeletal: Negative for gait problem or joint swelling.  Neurological: Negative for dizziness or headache.  No other specific complaints in a complete review of systems (except as listed in HPI above).     Objective:   Physical Exam  BP 132/88  Pulse 68  Temp 98 F (36.7 C) (Oral)  Ht 5' 9.5" (1.765 m)  Wt 207 lb 6.4 oz (94.076 kg)  BMI 30.19 kg/m2  SpO2 98% Wt Readings from Last 3 Encounters:  09/18/12 207 lb 6.4 oz (94.076 kg)  06/05/12 201 lb (91.173 kg)  05/18/12 201 lb 3.2 oz (91.264 kg)   Constitutional: She is overweight but appears well-developed and well-nourished. No distress.  HENT: Head: Normocephalic and atraumatic. Ears: B TMs ok, no erythema or effusion; Nose: Nose normal. Mouth/Throat: Oropharynx is clear and moist. No oropharyngeal exudate.  Eyes: Wears corrective lenses. Conjunctivae and EOM are normal. Pupils are equal, round, and reactive to light. No scleral icterus.  Neck: Normal range of motion. Neck supple. No JVD present. No thyromegaly present.  Cardiovascular: Normal rate, regular rhythm and normal heart sounds.  No murmur heard. No BLE edema. Pulmonary/Chest: Effort normal and breath sounds normal. No respiratory distress. She has no wheezes.  Abdominal: Soft. Bowel sounds are normal. She exhibits no distension.  There is no tenderness. no masses Musculoskeletal: Normal range of motion, no joint effusions. No gross deformities.  GU: Defer to GYN  Neurological: She is alert and oriented to person, place, and time. No cranial nerve deficit. Coordination normal.  Skin: Skin is warm and dry. No rash noted. No erythema.  Psychiatric: She has a normal mood and affect. Her behavior is normal. Judgment and thought content normal.   Lab Results  Component Value Date   WBC 5.3 09/03/2011    HGB 14.3 09/03/2011   HCT 41.0 09/03/2011   PLT 224.0 09/03/2011   GLUCOSE 89 09/03/2011   CHOL 221* 09/03/2011   TRIG 82.0 09/03/2011   HDL 56.70 09/03/2011   LDLDIRECT 148.1 09/03/2011   LDLCALC 160 09/06/2008   ALT 36* 09/03/2011   AST 31 09/03/2011   NA 139 09/03/2011   K 4.0 09/03/2011   CL 104 09/03/2011   CREATININE 0.9 09/03/2011   BUN 14 09/03/2011   CO2 27 09/03/2011   TSH 2.94 09/03/2011       Assessment & Plan:  CPX - v70.0 - Patient has been counseled on age-appropriate routine health concerns for screening and prevention. These are reviewed and up-to-date. Immunizations are up-to-date or declined. Labs reviewed.

## 2012-10-05 ENCOUNTER — Ambulatory Visit: Payer: BC Managed Care – PPO

## 2012-10-19 ENCOUNTER — Ambulatory Visit
Admission: RE | Admit: 2012-10-19 | Discharge: 2012-10-19 | Disposition: A | Discharge status: Home / Self Care | Payer: BC Managed Care – PPO | Source: Ambulatory Visit | Attending: Internal Medicine | Admitting: Internal Medicine

## 2012-10-19 DIAGNOSIS — Z1231 Encounter for screening mammogram for malignant neoplasm of breast: Secondary | ICD-10-CM

## 2013-02-28 ENCOUNTER — Other Ambulatory Visit: Payer: Self-pay | Admitting: Internal Medicine

## 2013-09-03 DIAGNOSIS — Z85820 Personal history of malignant melanoma of skin: Secondary | ICD-10-CM | POA: Insufficient documentation

## 2013-09-18 ENCOUNTER — Ambulatory Visit (INDEPENDENT_AMBULATORY_CARE_PROVIDER_SITE_OTHER): Payer: BC Managed Care – PPO | Admitting: Internal Medicine

## 2013-09-18 ENCOUNTER — Encounter: Payer: BC Managed Care – PPO | Admitting: Internal Medicine

## 2013-09-18 ENCOUNTER — Other Ambulatory Visit (INDEPENDENT_AMBULATORY_CARE_PROVIDER_SITE_OTHER): Payer: BC Managed Care – PPO

## 2013-09-18 ENCOUNTER — Encounter: Payer: Self-pay | Admitting: Internal Medicine

## 2013-09-18 VITALS — BP 130/82 | HR 64 | Temp 97.1°F | Ht 69.5 in | Wt 188.4 lb

## 2013-09-18 DIAGNOSIS — I10 Essential (primary) hypertension: Secondary | ICD-10-CM

## 2013-09-18 DIAGNOSIS — R9431 Abnormal electrocardiogram [ECG] [EKG]: Secondary | ICD-10-CM

## 2013-09-18 DIAGNOSIS — Z0000 Encounter for general adult medical examination without abnormal findings: Secondary | ICD-10-CM

## 2013-09-18 DIAGNOSIS — R011 Cardiac murmur, unspecified: Secondary | ICD-10-CM

## 2013-09-18 LAB — URINALYSIS, ROUTINE W REFLEX MICROSCOPIC
Leukocytes, UA: NEGATIVE
RBC / HPF: NONE SEEN (ref 0–?)
Specific Gravity, Urine: <=1.005 — AB (ref 1.000–1.030)
Urine Glucose: NEGATIVE
Urobilinogen, UA: 0.2 (ref 0.0–1.0)
WBC, UA: NONE SEEN (ref 0–?)

## 2013-09-18 LAB — LDL CHOLESTEROL, DIRECT: Direct LDL: 143 mg/dL

## 2013-09-18 LAB — BASIC METABOLIC PANEL
CO2: 31 mEq/L (ref 19–32)
Calcium: 9.4 mg/dL (ref 8.4–10.5)
Creatinine, Ser: 0.8 mg/dL (ref 0.4–1.2)
GFR: 83.9 mL/min (ref >60.00)
Sodium: 139 mEq/L (ref 135–145)

## 2013-09-18 LAB — CBC WITH DIFFERENTIAL/PLATELET
Basophils Relative: 0.6 % (ref 0.0–3.0)
Eosinophils Absolute: 0.2 10*3/uL (ref 0.0–0.7)
Hemoglobin: 14.9 g/dL (ref 12.0–15.0)
Lymphocytes Relative: 32.3 % (ref 12.0–46.0)
MCHC: 34.3 g/dL (ref 30.0–36.0)
MCV: 91.1 fl (ref 78.0–100.0)
Monocytes Absolute: 0.5 10*3/uL (ref 0.1–1.0)
Monocytes Relative: 9.4 % (ref 3.0–12.0)
Neutrophils Relative %: 53.9 % (ref 43.0–77.0)
RBC: 4.78 Mil/uL (ref 3.87–5.11)
WBC: 5.2 10*3/uL (ref 4.5–10.5)

## 2013-09-18 LAB — HEPATIC FUNCTION PANEL
ALT: 25 U/L (ref 0–35)
AST: 26 U/L (ref 0–37)
Albumin: 4.6 g/dL (ref 3.5–5.2)
Alkaline Phosphatase: 45 U/L (ref 39–117)
Total Protein: 7.6 g/dL (ref 6.0–8.3)

## 2013-09-18 LAB — LIPID PANEL
Cholesterol: 211 mg/dL — ABNORMAL HIGH (ref 0–200)
HDL: 61.9 mg/dL (ref >39.00)
VLDL: 12.8 mg/dL (ref 0.0–40.0)

## 2013-09-18 LAB — TSH: TSH: 3.01 u[IU]/mL (ref 0.35–5.50)

## 2013-09-18 MED ORDER — HYDROCHLOROTHIAZIDE 25 MG PO TABS: ORAL_TABLET | ORAL | Status: DC

## 2013-09-18 NOTE — Assessment & Plan Note (Signed)
BP Readings from Last 3 Encounters:  09/18/13 130/82  09/18/12 132/88  06/05/12 130/68   The current medical regimen is generally effective;  continue present plan and medications.

## 2013-09-18 NOTE — Progress Notes (Signed)
Subjective:    Patient ID: Caroline Wall, female    DOB: April 18, 1961, 52 y.o.   MRN: [id number]  HPI patient is here today for annual physical. Patient feels well and has no complaints.  Also reviewed chronic medical issues and interval medical events:  Depression. History of same, currently compensated without medication. On various medical treatments for same over past 10 years. Initial episode precipitated by "bad breakup" -has tried sertraline and Wellbutrin in past with ineffective results. On Cymbalta 2009 thru 06/2012 with good control, weaned off same summer 2013. Not currently in counseling but has completed work on same in past. Denies SI/HI or current symptoms of insomnia, fatigue or depression  Melanoma. Personal history of same in 1996 located on left low back. Status post wide excision with no evidence of recurrence. Follows annually at [facility] for a dermatology review and body mapping. Follows with Dr. Maisie Fus for same  Hypertension. On diuretic therapy for same for years. the patient reports compliance with medication(s) as prescribed. Denies adverse side effects.  Past Medical History  Diagnosis Date  . Depression     prior tx failure with sertraline and wellbutrin  . Hypertension   . Melanoma 1996    L low back - follows annually for body scan at [facility]  . GERD (gastroesophageal reflux disease)   . Heart murmur    Family History  Problem Relation Age of Onset  . Endometrial cancer Mother 22  . Hypertension Father   . Clotting disorder Father   . Diabetes Paternal Grandmother   . Heart disease Paternal Grandmother 66  . Colon cancer Neg Hx   . Esophageal cancer Neg Hx   . Rectal cancer Neg Hx   . Stomach cancer Neg Hx    History  Substance Use Topics  . Smoking status: Former Games developer  . Smokeless tobacco: Never Used     Comment: Lives 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 (married 06/2013) and their 2 adopted children  . Alcohol Use: 5.4 oz/week    9 Glasses of  wine per week    Review of Systems  Constitutional: Negative for fatigue and unexpected weight change.  Respiratory: Negative for cough, shortness of breath and wheezing.   Cardiovascular: Positive for palpitations (rare, when "stressed"). Negative for chest pain and leg swelling.  Gastrointestinal: Negative for nausea, abdominal pain and diarrhea.  Neurological: Negative for dizziness, weakness, light-headedness and headaches.  Psychiatric/Behavioral: Negative for dysphoric mood. The patient is not nervous/anxious.   All other systems reviewed and are negative.       Objective:   Physical Exam BP 130/82  Pulse 64  Temp(Src) 97.1 F (36.2 C) (Oral)  Ht 5' 9.5" (1.765 m)  Wt 188 lb 6.4 oz (85.458 kg)  BMI 27.43 kg/m2  SpO2 95% Wt Readings from Last 3 Encounters:  09/18/13 188 lb 6.4 oz (85.458 kg)  09/18/12 207 lb 6.4 oz (94.076 kg)  06/05/12 201 lb (91.173 kg)   Constitutional: She is overweight, but appears well-developed and well-nourished. No distress.  HENT: Head: Normocephalic and atraumatic. Ears: B TMs ok, no erythema or effusion; Nose: Nose normal. Mouth/Throat: mild lip swelling. Oropharynx is clear and moist. No oropharyngeal exudate.  Eyes: Conjunctivae and EOM are normal. Pupils are equal, round, and reactive to light. No scleral icterus.  Neck: Normal range of motion. Neck supple. No JVD present. No thyromegaly present.  Cardiovascular: Normal rate, irregular rhythm and normal heart sounds. 2/6 systolic murmur heard. No BLE edema. Pulmonary/Chest: Effort normal and  breath sounds normal. No respiratory distress. She has no wheezes.  Abdominal: Soft. Bowel sounds are normal. She exhibits no distension. There is no tenderness. no masses Musculoskeletal: Normal range of motion, no joint effusions. No gross deformities Neurological: She is alert and oriented to person, place, and time. No cranial nerve deficit. Coordination, balance, strength, speech and gait are normal.   Skin: severely chapped lips with mild soft tissue swelling but no angioedema. Remaining skin is warm and dry. No rash noted. No erythema.  Psychiatric: She has a normal mood and affect. Her behavior is normal. Judgment and thought content normal.   Lab Results  Component Value Date   WBC 5.1 09/18/2012   HGB 13.6 09/18/2012   HCT 38.9 09/18/2012   PLT 197.0 09/18/2012   GLUCOSE 100* 09/18/2012   CHOL 177 09/18/2012   TRIG 75.0 09/18/2012   HDL 44.90 09/18/2012   LDLDIRECT 148.1 09/03/2011   LDLCALC 117* 09/18/2012   ALT 21 09/18/2012   AST 20 09/18/2012   NA 136 09/18/2012   K 3.5 09/18/2012   CL 102 09/18/2012   CREATININE 0.9 09/18/2012   BUN 14 09/18/2012   CO2 26 09/18/2012   TSH 2.55 09/18/2012   ECG: ?A. Flutter vs fib at 56 beats per minute. Poor baseline tracing but appears to have P waves. No ischemic change evident     Assessment & Plan:   CPX/v70.0 - Patient has been counseled on age-appropriate routine health concerns for screening and prevention. These are reviewed and up-to-date. Immunizations are up-to-date or declined. Labs and ECG reviewed.  Arrhythmia. Occasional palpitations, describes episodes when stressed. Never exertional and not associated with dizziness, near syncope or syncope. No chest pain or shortness of breath. Given abnormal ECG with heart murmur, refer for 2-D echo and consider cardiology evaluation if abnormal or if symptomatic  Chapped lips. Reassurance and education provided

## 2013-09-18 NOTE — Patient Instructions (Addendum)
It was good to see you today.  We have reviewed your prior records including labs and tests today  Health Maintenance reviewed - all recommended immunizations and age-appropriate screenings are up-to-date.  Test(s) ordered today. Your results will be released to MyChart (or called to you) after review, usually within 72hours after test completion. If any changes need to be made, you will be notified at that same time.  Medications reviewed and updated, no changes recommended at this time.  we'll make referral for echocardiogram to Evaluate her heart anatomy for murmur and irregular beat. Our office will contact you regarding appointment(s) once made.  Please schedule followup in 12 months for annual exam and labs, call sooner if problems.  Health Maintenance, Female A healthy lifestyle and preventative care can promote health and wellness.  Maintain regular health, dental, and eye exams.  Eat a healthy diet. Foods like vegetables, fruits, whole grains, low-fat dairy products, and lean protein foods contain the nutrients you need without too many calories. Decrease your intake of foods high in solid fats, added sugars, and salt. Get information about a proper diet from your caregiver, if necessary.  Regular physical exercise is one of the most important things you can do for your health. Most adults should get at least 150 minutes of moderate-intensity exercise (any activity that increases your heart rate and causes you to sweat) each week. In addition, most adults need muscle-strengthening exercises on 2 or more days a week.   Maintain a healthy weight. The body mass index (BMI) is a screening tool to identify possible weight problems. It provides an estimate of body fat based on height and weight. Your caregiver can help determine your BMI, and can help you achieve or maintain a healthy weight. For adults 20 years and older:  A BMI below 18.5 is considered underweight.  A BMI of 18.5 to  24.9 is normal.  A BMI of 25 to 29.9 is considered overweight.  A BMI of 30 and above is considered obese.  Maintain normal blood lipids and cholesterol by exercising and minimizing your intake of saturated fat. Eat a balanced diet with plenty of fruits and vegetables. Blood tests for lipids and cholesterol should begin at age 61 and be repeated every 5 years. If your lipid or cholesterol levels are high, you are over 50, or you are a high risk for heart disease, you may need your cholesterol levels checked more frequently.Ongoing high lipid and cholesterol levels should be treated with medicines if diet and exercise are not effective.  If you smoke, find out from your caregiver how to quit. If you do not use tobacco, do not start.  Lung cancer screening is recommended for adults aged 66 80 years who are at high risk for developing lung cancer because of a history of smoking. Yearly low-dose computed tomography (CT) is recommended for people who have at least a 30-pack-year history of smoking and are a current smoker or have quit within the past 15 years. A pack year of smoking is smoking an average of 1 pack of cigarettes a day for 1 year (for example: 1 pack a day for 30 years or 2 packs a day for 15 years). Yearly screening should continue until the smoker has stopped smoking for at least 15 years. Yearly screening should also be stopped for people who develop a health problem that would prevent them from having lung cancer treatment.  If you are pregnant, do not drink alcohol. If you are breastfeeding,  be very cautious about drinking alcohol. If you are not pregnant and choose to drink alcohol, do not exceed 1 drink per day. One drink is considered to be 12 ounces (355 mL) of beer, 5 ounces (148 mL) of wine, or 1.5 ounces (44 mL) of liquor.  Avoid use of street drugs. Do not share needles with anyone. Ask for help if you need support or instructions about stopping the use of drugs.  High blood  pressure causes heart disease and increases the risk of stroke. Blood pressure should be checked at least every 1 to 2 years. Ongoing high blood pressure should be treated with medicines, if weight loss and exercise are not effective.  If you are 67 to 51 years old, ask your caregiver if you should take aspirin to prevent strokes.  Diabetes screening involves taking a blood sample to check your fasting blood sugar level. This should be done once every 3 years, after age 89, if you are within normal weight and without risk factors for diabetes. Testing should be considered at a younger age or be carried out more frequently if you are overweight and have at least 1 risk factor for diabetes.  Breast cancer screening is essential preventative care for women. You should practice "breast self-awareness." This means understanding the normal appearance and feel of your breasts and may include breast self-examination. Any changes detected, no matter how small, should be reported to a caregiver. Women in their 54s and 30s should have a clinical breast exam (CBE) by a caregiver as part of a regular health exam every 1 to 3 years. After age 60, women should have a CBE every year. Starting at age 41, women should consider having a mammogram (breast X-ray) every year. Women who have a family history of breast cancer should talk to their caregiver about genetic screening. Women at a high risk of breast cancer should talk to their caregiver about having an MRI and a mammogram every year.  Breast cancer gene (BRCA)-related cancer risk assessment is recommended for women who have family members with BRCA-related cancers. BRCA-related cancers include breast, ovarian, tubal, and peritoneal cancers. Having family members with these cancers may be associated with an increased risk for harmful changes (mutations) in the breast cancer genes BRCA1 and BRCA2. Results of the assessment will determine the need for genetic counseling  and BRCA1 and BRCA2 testing.  The Pap test is a screening test for cervical cancer. Women should have a Pap test starting at age 55. Between ages 79 and 14, Pap tests should be repeated every 2 years. Beginning at age 13, you should have a Pap test every 3 years as long as the past 3 Pap tests have been normal. If you had a hysterectomy for a problem that was not cancer or a condition that could lead to cancer, then you no longer need Pap tests. If you are between ages 78 and 64, and you have had normal Pap tests going back 10 years, you no longer need Pap tests. If you have had past treatment for cervical cancer or a condition that could lead to cancer, you need Pap tests and screening for cancer for at least 20 years after your treatment. If Pap tests have been discontinued, risk factors (such as a new sexual partner) need to be reassessed to determine if screening should be resumed. Some women have medical problems that increase the chance of getting cervical cancer. In these cases, your caregiver may recommend more frequent screening and  Pap tests.  The human papillomavirus (HPV) test is an additional test that may be used for cervical cancer screening. The HPV test looks for the virus that can cause the cell changes on the cervix. The cells collected during the Pap test can be tested for HPV. The HPV test could be used to screen women aged 54 years and older, and should be used in women of any age who have unclear Pap test results. After the age of 70, women should have HPV testing at the same frequency as a Pap test.  Colorectal cancer can be detected and often prevented. Most routine colorectal cancer screening begins at the age of 59 and continues through age 10. However, your caregiver may recommend screening at an earlier age if you have risk factors for colon cancer. On a yearly basis, your caregiver may provide home test kits to check for hidden blood in the stool. Use of a small camera at the end  of a tube, to directly examine the colon (sigmoidoscopy or colonoscopy), can detect the earliest forms of colorectal cancer. Talk to your caregiver about this at age 23, when routine screening begins. Direct examination of the colon should be repeated every 5 to 10 years through age 34, unless early forms of pre-cancerous polyps or small growths are found.  Hepatitis C blood testing is recommended for all people born from [birth year] through 1964 and any individual with known risks for hepatitis C.  Practice safe sex. Use condoms and avoid high-risk sexual practices to reduce the spread of sexually transmitted infections (STIs). Sexually active women aged 56 and younger should be checked for Chlamydia, which is a common sexually transmitted infection. Older women with new or multiple partners should also be tested for Chlamydia. Testing for other STIs is recommended if you are sexually active and at increased risk.  Osteoporosis is a disease in which the bones lose minerals and strength with aging. This can result in serious bone fractures. The risk of osteoporosis can be identified using a bone density scan. Women ages 50 and over and women at risk for fractures or osteoporosis should discuss screening with their caregivers. Ask your caregiver whether you should be taking a calcium supplement or vitamin D to reduce the rate of osteoporosis.  Menopause can be associated with physical symptoms and risks. Hormone replacement therapy is available to decrease symptoms and risks. You should talk to your caregiver about whether hormone replacement therapy is right for you.  Use sunscreen. Apply sunscreen liberally and repeatedly throughout the day. You should seek shade when your shadow is shorter than you. Protect yourself by wearing long sleeves, pants, a wide-brimmed hat, and sunglasses year round, whenever you are outdoors.  Notify your caregiver of new moles or changes in moles, especially if there is a change  in shape or color. Also notify your caregiver if a mole is larger than the size of a pencil eraser.  Stay current with your immunizations. Document Released: 03/29/2011 Document Revised: 01/08/2013 Document Reviewed: 03/29/2011 [facility] Patient Information 2014 [place], [place]. Hypertension Hypertension is another name for high blood pressure. High blood pressure may mean that your heart needs to work harder to pump blood. Blood pressure consists of two numbers, which includes a higher number over a lower number (example: 110/72). HOME CARE   Make lifestyle changes as told by your doctor. This may include weight loss and exercise.  Take your blood pressure medicine every day.  Limit how much salt you use.  Stop  smoking if you smoke.  Do not use drugs.  Talk to your doctor if you are using decongestants or birth control pills. These medicines might make blood pressure higher.  Females should not drink more than 1 alcoholic drink per day. Males should not drink more than 2 alcoholic drinks per day.  See your doctor as told. GET HELP RIGHT AWAY IF:   You have a blood pressure reading with a top number of 180 or higher.  You get a very bad headache.  You get blurred or changing vision.  You feel confused.  You feel weak, numb, or faint.  You get chest or belly (abdominal) pain.  You throw up (vomit).  You cannot breathe very well. MAKE SURE YOU:   Understand these instructions.  Will watch your condition.  Will get help right away if you are not doing well or get worse. Document Released: 03/01/2008 Document Revised: 12/06/2011 Document Reviewed: 03/01/2008 [facility] Patient Information 2014 [place], [place].

## 2013-09-18 NOTE — Progress Notes (Signed)
Pre-visit discussion using our clinic review tool. No additional management support is needed unless otherwise documented below in the visit note.  

## 2013-10-04 ENCOUNTER — Telehealth: Payer: Self-pay

## 2013-10-04 DIAGNOSIS — Z13820 Encounter for screening for osteoporosis: Secondary | ICD-10-CM

## 2013-10-04 NOTE — Telephone Encounter (Signed)
done

## 2013-10-04 NOTE — Telephone Encounter (Signed)
Called pt no answer [facility] md place referral will be contacted once appt has been set-up...Caroline Wall

## 2013-10-04 NOTE — Telephone Encounter (Signed)
The patient called hoping to get a referral for her bone density scan to be done at the same place she gets her mammograms done.   Thanks!

## 2013-10-11 ENCOUNTER — Other Ambulatory Visit (HOSPITAL_COMMUNITY): Payer: BC Managed Care – PPO

## 2013-10-18 ENCOUNTER — Other Ambulatory Visit (HOSPITAL_COMMUNITY): Payer: Self-pay | Admitting: Internal Medicine

## 2013-10-18 ENCOUNTER — Ambulatory Visit (HOSPITAL_COMMUNITY): Payer: BC Managed Care – PPO | Attending: Internal Medicine | Admitting: Radiology

## 2013-10-18 ENCOUNTER — Encounter: Payer: Self-pay | Admitting: Cardiovascular Disease

## 2013-10-18 DIAGNOSIS — R9431 Abnormal electrocardiogram [ECG] [EKG]: Secondary | ICD-10-CM

## 2013-10-18 DIAGNOSIS — Z13820 Encounter for screening for osteoporosis: Secondary | ICD-10-CM

## 2013-10-18 DIAGNOSIS — R011 Cardiac murmur, unspecified: Secondary | ICD-10-CM

## 2013-10-18 DIAGNOSIS — I10 Essential (primary) hypertension: Secondary | ICD-10-CM

## 2013-10-18 DIAGNOSIS — Z87891 Personal history of nicotine dependence: Secondary | ICD-10-CM | POA: Insufficient documentation

## 2013-10-18 NOTE — Progress Notes (Signed)
Echocardiogram performed.  

## 2013-10-22 ENCOUNTER — Encounter: Payer: Self-pay | Admitting: Internal Medicine

## 2013-10-26 ENCOUNTER — Telehealth: Payer: Self-pay | Admitting: Internal Medicine

## 2013-10-26 DIAGNOSIS — M8580 Other specified disorders of bone density and structure, unspecified site: Secondary | ICD-10-CM

## 2013-10-26 NOTE — Telephone Encounter (Signed)
Tell [facility] they will need to provide me with a "qualifying" code (dx) to choose from as I do not know what "screening" code they will approve I will change the order once I have this info thanks

## 2013-10-26 NOTE — Telephone Encounter (Signed)
Pt called to check up on the bone density referral request from 10/04/13. [facility] request different diagnostic code for bone density so they can contact pt. Please see note under order and please advise.

## 2013-10-29 NOTE — Telephone Encounter (Signed)
Called GI and spoke with Cherish and she stated that if this pt's first time for bone density we can use "osteopenia [id number]" Please advise.

## 2013-10-29 NOTE — Telephone Encounter (Signed)
Ok - reordered with new code as requested

## 2013-10-29 NOTE — Telephone Encounter (Signed)
[facility] please follow up. Thank you

## 2013-11-06 ENCOUNTER — Other Ambulatory Visit: Payer: Self-pay

## 2013-11-06 DIAGNOSIS — Z1231 Encounter for screening mammogram for malignant neoplasm of breast: Secondary | ICD-10-CM

## 2013-12-03 ENCOUNTER — Other Ambulatory Visit: Payer: BC Managed Care – PPO

## 2013-12-03 ENCOUNTER — Ambulatory Visit: Payer: BC Managed Care – PPO

## 2013-12-11 ENCOUNTER — Other Ambulatory Visit: Payer: Self-pay | Admitting: Internal Medicine

## 2013-12-14 ENCOUNTER — Ambulatory Visit
Admission: RE | Admit: 2013-12-14 | Discharge: 2013-12-14 | Disposition: A | Discharge status: Home / Self Care | Payer: BC Managed Care – PPO | Source: Ambulatory Visit | Attending: Internal Medicine | Admitting: Internal Medicine

## 2013-12-14 ENCOUNTER — Ambulatory Visit
Admission: RE | Admit: 2013-12-14 | Discharge: 2013-12-14 | Disposition: A | Discharge status: Home / Self Care | Payer: BC Managed Care – PPO | Source: Ambulatory Visit

## 2013-12-14 DIAGNOSIS — Z1231 Encounter for screening mammogram for malignant neoplasm of breast: Secondary | ICD-10-CM

## 2013-12-14 DIAGNOSIS — M8580 Other specified disorders of bone density and structure, unspecified site: Secondary | ICD-10-CM

## 2013-12-26 LAB — HM PAP SMEAR

## 2014-10-10 ENCOUNTER — Encounter: Payer: BC Managed Care – PPO | Admitting: Internal Medicine

## 2014-10-18 ENCOUNTER — Encounter: Payer: BC Managed Care – PPO | Admitting: Internal Medicine

## 2014-11-15 ENCOUNTER — Other Ambulatory Visit: Payer: Self-pay

## 2014-11-15 DIAGNOSIS — Z1231 Encounter for screening mammogram for malignant neoplasm of breast: Secondary | ICD-10-CM

## 2014-11-26 DIAGNOSIS — C44711 Basal cell carcinoma of skin of unspecified lower limb, including hip: Secondary | ICD-10-CM

## 2014-11-26 HISTORY — DX: Basal cell carcinoma of skin of unspecified lower limb, including hip: C44.711

## 2014-12-19 ENCOUNTER — Ambulatory Visit
Admission: RE | Admit: 2014-12-19 | Discharge: 2014-12-19 | Disposition: A | Discharge status: Home / Self Care | Payer: BC Managed Care – PPO | Source: Ambulatory Visit

## 2014-12-19 DIAGNOSIS — Z1231 Encounter for screening mammogram for malignant neoplasm of breast: Secondary | ICD-10-CM

## 2014-12-30 ENCOUNTER — Other Ambulatory Visit (INDEPENDENT_AMBULATORY_CARE_PROVIDER_SITE_OTHER): Payer: BC Managed Care – PPO

## 2014-12-30 ENCOUNTER — Encounter: Payer: Self-pay | Admitting: Internal Medicine

## 2014-12-30 ENCOUNTER — Ambulatory Visit (INDEPENDENT_AMBULATORY_CARE_PROVIDER_SITE_OTHER): Payer: BC Managed Care – PPO | Admitting: Internal Medicine

## 2014-12-30 VITALS — BP 118/78 | HR 49 | Temp 98.3°F | Resp 16 | Wt 201.0 lb

## 2014-12-30 DIAGNOSIS — Z23 Encounter for immunization: Secondary | ICD-10-CM

## 2014-12-30 DIAGNOSIS — Z0000 Encounter for general adult medical examination without abnormal findings: Secondary | ICD-10-CM

## 2014-12-30 DIAGNOSIS — I10 Essential (primary) hypertension: Secondary | ICD-10-CM | POA: Diagnosis not present

## 2014-12-30 DIAGNOSIS — R739 Hyperglycemia, unspecified: Secondary | ICD-10-CM

## 2014-12-30 DIAGNOSIS — E663 Overweight: Secondary | ICD-10-CM | POA: Diagnosis not present

## 2014-12-30 LAB — BASIC METABOLIC PANEL
BUN: 16 mg/dL (ref 6–23)
CALCIUM: 9.4 mg/dL (ref 8.4–10.5)
CHLORIDE: 105 meq/L (ref 96–112)
CO2: 28 mEq/L (ref 19–32)
CREATININE: 0.81 mg/dL (ref 0.40–1.20)
GFR: 78.74 mL/min (ref >60.00)
Glucose, Bld: 97 mg/dL (ref 70–99)
Potassium: 4.6 mEq/L (ref 3.5–5.1)
SODIUM: 139 meq/L (ref 135–145)

## 2014-12-30 LAB — URINALYSIS, ROUTINE W REFLEX MICROSCOPIC
Bilirubin Urine: NEGATIVE
HGB URINE DIPSTICK: NEGATIVE
Ketones, ur: NEGATIVE
LEUKOCYTES UA: NEGATIVE
NITRITE: NEGATIVE
PH: 5.5 (ref 5.0–8.0)
RBC / HPF: NONE SEEN (ref 0–?)
Specific Gravity, Urine: >=1.03 — AB (ref 1.000–1.030)
Total Protein, Urine: NEGATIVE
Urine Glucose: NEGATIVE
Urobilinogen, UA: 0.2 (ref 0.0–1.0)

## 2014-12-30 LAB — CBC WITH DIFFERENTIAL/PLATELET
BASOS PCT: 0.5 % (ref 0.0–3.0)
Basophils Absolute: 0 10*3/uL (ref 0.0–0.1)
EOS PCT: 4.9 % (ref 0.0–5.0)
Eosinophils Absolute: 0.2 10*3/uL (ref 0.0–0.7)
HEMATOCRIT: 41.2 % (ref 36.0–46.0)
Hemoglobin: 14.3 g/dL (ref 12.0–15.0)
Lymphocytes Relative: 29.7 % (ref 12.0–46.0)
Lymphs Abs: 1.3 10*3/uL (ref 0.7–4.0)
MCHC: 34.8 g/dL (ref 30.0–36.0)
MCV: 89.6 fl (ref 78.0–100.0)
MONO ABS: 0.4 10*3/uL (ref 0.1–1.0)
Monocytes Relative: 9.6 % (ref 3.0–12.0)
NEUTROS PCT: 55.3 % (ref 43.0–77.0)
Neutro Abs: 2.5 10*3/uL (ref 1.4–7.7)
Platelets: 215 10*3/uL (ref 150.0–400.0)
RBC: 4.59 Mil/uL (ref 3.87–5.11)
RDW: 12.6 % (ref 11.5–15.5)
WBC: 4.5 10*3/uL (ref 4.0–10.5)

## 2014-12-30 LAB — HEMOGLOBIN A1C: Hgb A1c MFr Bld: 5.1 % (ref 4.6–6.5)

## 2014-12-30 LAB — TSH: TSH: 4.77 u[IU]/mL — AB (ref 0.35–4.50)

## 2014-12-30 MED ORDER — HYDROCHLOROTHIAZIDE 25 MG PO TABS: 25.0000 mg | ORAL_TABLET | Freq: Every day | ORAL | Status: DC

## 2014-12-30 NOTE — Patient Instructions (Addendum)
It was good to see you today.  We have reviewed your prior records including labs and tests today  Health Maintenance reviewed - Prevnar 13 pneumonia vaccine updated today. All other recommended immunizations and age-appropriate screenings are up-to-date.  Test(s) ordered today. Your results will be released to [facility] (or called to you) after review, usually within 72hours after test completion. If any changes need to be made, you will be notified at that same time.  Medications reviewed and updated, no changes recommended at this time.  Please schedule followup in 12 months for annual exam and labs, call sooner if problems.  Health Maintenance Adopting a healthy lifestyle and getting preventive care can go a long way to promote health and wellness. Talk with your health care provider about what schedule of regular examinations is right for you. This is a good chance for you to check in with your provider about disease prevention and staying healthy. In between checkups, there are plenty of things you can do on your own. Experts have done a lot of research about which lifestyle changes and preventive measures are most likely to keep you healthy. Ask your health care provider for more information. WEIGHT AND DIET  Eat a healthy diet  Be sure to include plenty of vegetables, fruits, low-fat dairy products, and lean protein.  Do not eat a lot of foods high in solid fats, added sugars, or salt.  Get regular exercise. This is one of the most important things you can do for your health.  Most adults should exercise for at least 150 minutes each week. The exercise should increase your heart rate and make you sweat (moderate-intensity exercise).  Most adults should also do strengthening exercises at least twice a week. This is in addition to the moderate-intensity exercise.  Maintain a healthy weight  Body mass index (BMI) is a measurement that can be used to identify possible weight problems.  It estimates body fat based on height and weight. Your health care provider can help determine your BMI and help you achieve or maintain a healthy weight.  For females 20 years of age and older:   A BMI below 18.5 is considered underweight.  A BMI of 18.5 to 24.9 is normal.  A BMI of 25 to 29.9 is considered overweight.  A BMI of 30 and above is considered obese.  Watch levels of cholesterol and blood lipids  You should start having your blood tested for lipids and cholesterol at 53 years of age, then have this test every 5 years.  You may need to have your cholesterol levels checked more often if:  Your lipid or cholesterol levels are high.  You are older than 53 years of age.  You are at high risk for heart disease.  CANCER SCREENING   Lung Cancer  Lung cancer screening is recommended for adults 38-55 years old who are at high risk for lung cancer because of a history of smoking.  A yearly low-dose CT scan of the lungs is recommended for people who:  Currently smoke.  Have quit within the past 15 years.  Have at least a 30-pack-year history of smoking. A pack year is smoking an average of one pack of cigarettes a day for 1 year.  Yearly screening should continue until it has been 15 years since you quit.  Yearly screening should stop if you develop a health problem that would prevent you from having lung cancer treatment.  Breast Cancer  Practice breast self-awareness. This means  understanding how your breasts normally appear and feel.  It also means doing regular breast self-exams. Let your health care provider know about any changes, no matter how small.  If you are in your 20s or 30s, you should have a clinical breast exam (CBE) by a health care provider every 1-3 years as part of a regular health exam.  If you are 25 or older, have a CBE every year. Also consider having a breast X-ray (mammogram) every year.  If you have a family history of breast cancer,  talk to your health care provider about genetic screening.  If you are at high risk for breast cancer, talk to your health care provider about having an MRI and a mammogram every year.  Breast cancer gene (BRCA) assessment is recommended for women who have family members with BRCA-related cancers. BRCA-related cancers include:  Breast.  Ovarian.  Tubal.  Peritoneal cancers.  Results of the assessment will determine the need for genetic counseling and BRCA1 and BRCA2 testing. Cervical Cancer Routine pelvic examinations to screen for cervical cancer are no longer recommended for nonpregnant women who are considered low risk for cancer of the pelvic organs (ovaries, uterus, and vagina) and who do not have symptoms. A pelvic examination may be necessary if you have symptoms including those associated with pelvic infections. Ask your health care provider if a screening pelvic exam is right for you.   The Pap test is the screening test for cervical cancer for women who are considered at risk.  If you had a hysterectomy for a problem that was not cancer or a condition that could lead to cancer, then you no longer need Pap tests.  If you are older than 65 years, and you have had normal Pap tests for the past 10 years, you no longer need to have Pap tests.  If you have had past treatment for cervical cancer or a condition that could lead to cancer, you need Pap tests and screening for cancer for at least 20 years after your treatment.  If you no longer get a Pap test, assess your risk factors if they change (such as having a new sexual partner). This can affect whether you should start being screened again.  Some women have medical problems that increase their chance of getting cervical cancer. If this is the case for you, your health care provider may recommend more frequent screening and Pap tests.  The human papillomavirus (HPV) test is another test that may be used for cervical cancer  screening. The HPV test looks for the virus that can cause cell changes in the cervix. The cells collected during the Pap test can be tested for HPV.  The HPV test can be used to screen women 27 years of age and older. Getting tested for HPV can extend the interval between normal Pap tests from three to five years.  An HPV test also should be used to screen women of any age who have unclear Pap test results.  After 53 years of age, women should have HPV testing as often as Pap tests.  Colorectal Cancer  This type of cancer can be detected and often prevented.  Routine colorectal cancer screening usually begins at 53 years of age and continues through 53 years of age.  Your health care provider may recommend screening at an earlier age if you have risk factors for colon cancer.  Your health care provider may also recommend using home test kits to check for hidden blood  in the stool.  A small camera at the end of a tube can be used to examine your colon directly (sigmoidoscopy or colonoscopy). This is done to check for the earliest forms of colorectal cancer.  Routine screening usually begins at age 55.  Direct examination of the colon should be repeated every 5-10 years through 53 years of age. However, you may need to be screened more often if early forms of precancerous polyps or small growths are found. Skin Cancer  Check your skin from head to toe regularly.  Tell your health care provider about any new moles or changes in moles, especially if there is a change in a mole's shape or color.  Also tell your health care provider if you have a mole that is larger than the size of a pencil eraser.  Always use sunscreen. Apply sunscreen liberally and repeatedly throughout the day.  Protect yourself by wearing long sleeves, pants, a wide-brimmed hat, and sunglasses whenever you are outside. HEART DISEASE, DIABETES, AND HIGH BLOOD PRESSURE   Have your blood pressure checked at least  every 1-2 years. High blood pressure causes heart disease and increases the risk of stroke.  If you are between 34 years and 77 years old, ask your health care provider if you should take aspirin to prevent strokes.  Have regular diabetes screenings. This involves taking a blood sample to check your fasting blood sugar level.  If you are at a normal weight and have a low risk for diabetes, have this test once every three years after 53 years of age.  If you are overweight and have a high risk for diabetes, consider being tested at a younger age or more often. PREVENTING INFECTION  Hepatitis B  If you have a higher risk for hepatitis B, you should be screened for this virus. You are considered at high risk for hepatitis B if:  You were born in a country where hepatitis B is common. Ask your health care provider which countries are considered high risk.  Your parents were born in a high-risk country, and you have not been immunized against hepatitis B (hepatitis B vaccine).  You have HIV or AIDS.  You use needles to inject street drugs.  You live with someone who has hepatitis B.  You have had sex with someone who has hepatitis B.  You get hemodialysis treatment.  You take certain medicines for conditions, including cancer, organ transplantation, and autoimmune conditions. Hepatitis C  Blood testing is recommended for:  Everyone born from [birth year] through 1964.  Anyone with known risk factors for hepatitis C. Sexually transmitted infections (STIs)  You should be screened for sexually transmitted infections (STIs) including gonorrhea and chlamydia if:  You are sexually active and are younger than 53 years of age.  You are older than 53 years of age and your health care provider tells you that you are at risk for this type of infection.  Your sexual activity has changed since you were last screened and you are at an increased risk for chlamydia or gonorrhea. Ask your health care  provider if you are at risk.  If you do not have HIV, but are at risk, it may be recommended that you take a prescription medicine daily to prevent HIV infection. This is called pre-exposure prophylaxis (PrEP). You are considered at risk if:  You are sexually active and do not regularly use condoms or know the HIV status of your partner(s).  You take drugs by injection.  You are sexually active with a partner who has HIV. Talk with your health care provider about whether you are at high risk of being infected with HIV. If you choose to begin PrEP, you should first be tested for HIV. You should then be tested every 3 months for as long as you are taking PrEP.  PREGNANCY   If you are premenopausal and you may become pregnant, ask your health care provider about preconception counseling.  If you may become pregnant, take 400 to 800 micrograms (mcg) of folic acid every day.  If you want to prevent pregnancy, talk to your health care provider about birth control (contraception). OSTEOPOROSIS AND MENOPAUSE   Osteoporosis is a disease in which the bones lose minerals and strength with aging. This can result in serious bone fractures. Your risk for osteoporosis can be identified using a bone density scan.  If you are 55 years of age or older, or if you are at risk for osteoporosis and fractures, ask your health care provider if you should be screened.  Ask your health care provider whether you should take a calcium or vitamin D supplement to lower your risk for osteoporosis.  Menopause may have certain physical symptoms and risks.  Hormone replacement therapy may reduce some of these symptoms and risks. Talk to your health care provider about whether hormone replacement therapy is right for you.  HOME CARE INSTRUCTIONS   Schedule regular health, dental, and eye exams.  Stay current with your immunizations.   Do not use any tobacco products including cigarettes, chewing tobacco, or  electronic cigarettes.  If you are pregnant, do not drink alcohol.  If you are breastfeeding, limit how much and how often you drink alcohol.  Limit alcohol intake to no more than 1 drink per day for nonpregnant women. One drink equals 12 ounces of beer, 5 ounces of wine, or 1 ounces of hard liquor.  Do not use street drugs.  Do not share needles.  Ask your health care provider for help if you need support or information about quitting drugs.  Tell your health care provider if you often feel depressed.  Tell your health care provider if you have ever been abused or do not feel safe at home. Document Released: 03/29/2011 Document Revised: 01/28/2014 Document Reviewed: 08/15/2013 [facility] Patient Information 2015 [place], [place]. This information is not intended to replace advice given to you by your health care provider. Make sure you discuss any questions you have with your health care provider.

## 2014-12-30 NOTE — Progress Notes (Signed)
Subjective:    Patient ID: Dacia Maggi, female    DOB: 10-Mar-1961, 54 y.o.   MRN: [id number]  HPI  patient is here today for annual physical. Patient feels well overall   Past Medical History  Diagnosis Date  . Depression     prior tx failure with sertraline and wellbutrin  . Hypertension   . Melanoma 1996    L low back - follows annually for body scan at [facility]  . GERD (gastroesophageal reflux disease)   . Heart murmur   . BCC (basal cell carcinoma), leg 11/2014    following at [facility] derm for same   Family History  Problem Relation Age of Onset  . Endometrial cancer Mother 63  . Hypertension Father   . Clotting disorder Father   . Diabetes Paternal Grandmother   . Heart disease Paternal Grandmother 46  . Colon cancer Neg Hx   . Esophageal cancer Neg Hx   . Rectal cancer Neg Hx   . Stomach cancer Neg Hx   . Atrial fibrillation Father    History  Substance Use Topics  . Smoking status: Former Smoker    Quit date: 09/27/1989  . Smokeless tobacco: Never Used  . Alcohol Use: 5.4 oz/week    9 Glasses of wine per week    Review of Systems  Constitutional: Negative for fatigue and unexpected weight change.  Respiratory: Negative for cough, shortness of breath and wheezing.   Cardiovascular: Negative for chest pain, palpitations and leg swelling.  Gastrointestinal: Negative for nausea, abdominal pain and diarrhea.  Neurological: Negative for dizziness, weakness, light-headedness and headaches.  Psychiatric/Behavioral: Negative for dysphoric mood. The patient is not nervous/anxious.   All other systems reviewed and are negative.      Objective:    Physical Exam  Constitutional: She is oriented to person, place, and time. She appears well-developed and well-nourished. No distress.  Overweight  HENT:  Head: Normocephalic and atraumatic.  Right Ear: External ear normal.  Left Ear: External ear normal.  Nose: Nose normal.  Mouth/Throat: Oropharynx is clear  and moist. No oropharyngeal exudate.  Eyes: EOM are normal. Pupils are equal, round, and reactive to light. Right eye exhibits no discharge. Left eye exhibits no discharge. No scleral icterus.  Neck: Normal range of motion. Neck supple. No JVD present. No tracheal deviation present. No thyromegaly present.  Cardiovascular: Normal rate, regular rhythm, normal heart sounds and intact distal pulses.  Exam reveals no friction rub.   No murmur heard. Pulmonary/Chest: Effort normal and breath sounds normal. No respiratory distress. She has no wheezes. She has no rales. She exhibits no tenderness.  Abdominal: Soft. Bowel sounds are normal. She exhibits no distension and no mass. There is no tenderness. There is no rebound and no guarding.  Genitourinary:  Defer to gyn  Musculoskeletal: Normal range of motion.  No gross deformities  Lymphadenopathy:    She has no cervical adenopathy.  Neurological: She is alert and oriented to person, place, and time. She has normal reflexes. No cranial nerve deficit.  Skin: Skin is warm and dry. No rash noted. She is not diaphoretic. No erythema.  Psychiatric: She has a normal mood and affect. Her behavior is normal. Judgment and thought content normal.  Nursing note and vitals reviewed.   BP 118/78 mmHg  Pulse 49  Temp(Src) 98.3 F (36.8 C) (Oral)  Resp 16  Wt 201 lb (91.173 kg)  SpO2 97% Wt Readings from Last 3 Encounters:  12/30/14 201 lb (91.173  kg)  09/18/13 188 lb 6.4 oz (85.458 kg)  09/18/12 207 lb 6.4 oz (94.076 kg)     Lab Results  Component Value Date   WBC 5.2 09/18/2013   HGB 14.9 09/18/2013   HCT 43.5 09/18/2013   PLT 208.0 09/18/2013   GLUCOSE 102* 09/18/2013   CHOL 211* 09/18/2013   TRIG 64.0 09/18/2013   HDL 61.90 09/18/2013   LDLDIRECT 143.0 09/18/2013   LDLCALC 117* 09/18/2012   ALT 25 09/18/2013   AST 26 09/18/2013   NA 139 09/18/2013   K 4.6 09/18/2013   CL 101 09/18/2013   CREATININE 0.8 09/18/2013   BUN 13 09/18/2013     CO2 31 09/18/2013   TSH 3.01 09/18/2013    Mm Screening Breast Tomo Bilateral  12/19/2014   CLINICAL DATA:  Screening.  EXAM: DIGITAL SCREENING BILATERAL MAMMOGRAM WITH 3D TOMO WITH CAD  COMPARISON:  Previous exam(s).  ACR Breast Density Category d: The breast tissue is extremely dense, which lowers the sensitivity of mammography.  FINDINGS: There are no findings suspicious for malignancy. Images were processed with CAD.  IMPRESSION: No mammographic evidence of malignancy. A result letter of this screening mammogram will be mailed directly to the patient.  RECOMMENDATION: Screening mammogram in one year. (Code:SM-B-01Y)  BI-RADS CATEGORY  1: Negative.   Electronically Signed   By: Conchita Paris M.D.   On: 12/19/2014 12:07       Assessment & Plan:   CPX/z00.00 - Patient has been counseled on age-appropriate routine health concerns for screening and prevention. These are reviewed and up-to-date. Immunizations are up-to-date or declined. Labs ordered and reviewed.  Mild hyperglycemia. Check A1c. Patient advised on need for weight loss and aerobic exercise to control risk of diabetes development  Overweight. Reviewed weight trends in the past few years. The patient is asked to make an attempt to improve diet and exercise patterns to aid in medical management of this problem.  Problem List Items Addressed This Visit    Hypertension    BP Readings from Last 3 Encounters:  12/30/14 118/78  09/18/13 130/82  09/18/12 132/88   The current medical regimen is generally effective;  continue present plan and medications.       Relevant Medications   hydrochlorothiazide tablet    Other Visit Diagnoses    Routine general medical examination at a health care facility    -  Primary    Relevant Orders    TSH    Lipid panel    Basic Metabolic Panel (BMET)    Hepatic function panel    CBC w/Diff    Urinalysis, Routine w reflex microscopic    Hyperglycemia        Relevant Orders    Hemoglobin  A1c    Overweight (BMI 25.0-29.9)            Gwendolyn Grant, MD

## 2014-12-30 NOTE — Assessment & Plan Note (Signed)
BP Readings from Last 3 Encounters:  12/30/14 118/78  09/18/13 130/82  09/18/12 132/88   The current medical regimen is generally effective;  continue present plan and medications.

## 2014-12-31 DIAGNOSIS — Z85828 Personal history of other malignant neoplasm of skin: Secondary | ICD-10-CM | POA: Insufficient documentation

## 2014-12-31 DIAGNOSIS — C4491 Basal cell carcinoma of skin, unspecified: Secondary | ICD-10-CM | POA: Insufficient documentation

## 2015-01-02 ENCOUNTER — Encounter: Payer: Self-pay | Admitting: Internal Medicine

## 2015-01-02 DIAGNOSIS — Z0000 Encounter for general adult medical examination without abnormal findings: Secondary | ICD-10-CM

## 2015-01-02 NOTE — Telephone Encounter (Signed)
No lipid done with last labs. I have pended order if you would like for her to have it done.

## 2015-05-24 ENCOUNTER — Telehealth: Payer: Self-pay | Admitting: Internal Medicine

## 2015-05-24 NOTE — Telephone Encounter (Signed)
noted 

## 2015-05-25 ENCOUNTER — Encounter: Payer: Self-pay | Admitting: Internal Medicine

## 2015-06-03 ENCOUNTER — Encounter: Payer: Self-pay | Admitting: Internal Medicine

## 2015-08-12 ENCOUNTER — Encounter: Payer: BC Managed Care – PPO | Admitting: Internal Medicine

## 2015-08-18 ENCOUNTER — Encounter: Payer: BC Managed Care – PPO | Admitting: Internal Medicine

## 2015-09-02 ENCOUNTER — Encounter: Payer: Self-pay | Admitting: Internal Medicine

## 2015-09-02 ENCOUNTER — Ambulatory Visit (AMBULATORY_SURGERY_CENTER): Payer: Self-pay

## 2015-09-02 VITALS — Ht 69.5 in | Wt 204.6 lb

## 2015-09-02 DIAGNOSIS — Z86010 Personal history of colonic polyps: Secondary | ICD-10-CM

## 2015-09-02 MED ORDER — NA SULFATE-K SULFATE-MG SULF 17.5-3.13-1.6 GM/177ML PO SOLN: ORAL | Status: DC

## 2015-09-02 NOTE — Progress Notes (Signed)
Per pt, no allergies to soy or egg products.Pt not taking any weight loss meds or using  O2 at home. 

## 2015-09-16 ENCOUNTER — Ambulatory Visit (AMBULATORY_SURGERY_CENTER): Payer: BC Managed Care – PPO | Admitting: Internal Medicine

## 2015-09-16 ENCOUNTER — Encounter: Payer: Self-pay | Admitting: Internal Medicine

## 2015-09-16 VITALS — BP 123/56 | HR 57 | Temp 98.1°F | Resp 16 | Ht 69.0 in | Wt 204.0 lb

## 2015-09-16 DIAGNOSIS — D124 Benign neoplasm of descending colon: Secondary | ICD-10-CM

## 2015-09-16 DIAGNOSIS — K635 Polyp of colon: Secondary | ICD-10-CM

## 2015-09-16 DIAGNOSIS — Z86010 Personal history of colonic polyps: Secondary | ICD-10-CM | POA: Diagnosis not present

## 2015-09-16 MED ORDER — SODIUM CHLORIDE 0.9 % IV SOLN: 500.0000 mL | INTRAVENOUS | Status: DC

## 2015-09-16 NOTE — Progress Notes (Signed)
Called to room to assist during endoscopic procedure.  Patient ID and intended procedure confirmed with present staff. Received instructions for my participation in the procedure from the performing physician.  

## 2015-09-16 NOTE — Op Note (Signed)
[place]  [address] [place], [address]   COLONOSCOPY PROCEDURE REPORT  PATIENT: Caroline, Wall  MR#: [id number] BIRTHDATE: 1961/03/15 , 54  yrs. old GENDER: female ENDOSCOPIST: Jerene Bears, MD REFERRED [id number] Patterson, M.D. PROCEDURE DATE:  09/16/2015 PROCEDURE:   Colonoscopy, screening First Screening Colonoscopy - Avg.  risk and is 50 yrs.  old or older - No.  Prior Negative Screening - Now for repeat screening. Less than 10 yrs Prior Negative Screening - Now for repeat screening.  Other: See Comments  History of Adenoma - Now for follow-up colonoscopy & has been > or = to 3 yrs.  N/A  Polyps removed today? Yes ASA CLASS:   Class II INDICATIONS:Screening for colonic neoplasia and last colonoscopy September 2013 by Dr.  Sharlett Iles, recommended abbreviated interval due to incomplete visualization of the right colon secondary to tortuosity. MEDICATIONS: Monitored anesthesia care and Propofol 250 mg IV  DESCRIPTION OF PROCEDURE:   After the risks benefits and alternatives of the procedure were thoroughly explained, informed consent was obtained.  The digital rectal exam revealed no rectal mass.   The LB [id number]  endoscope was introduced through the anus and advanced to the cecum, which was identified by both the appendix and ileocecal valve. No adverse events experienced. The quality of the prep was good.  (Suprep was used)  The instrument was then slowly withdrawn as the colon was fully examined. Estimated blood loss is zero unless otherwise noted in this procedure report.   COLON FINDINGS: A sessile polyp measuring 4 mm in size was found in the descending colon.  A polypectomy was performed with a cold snare.  The resection was complete, the polyp tissue was completely retrieved and sent to histology.   The examination was otherwise normal.  Retroflexed views revealed no abnormalities. The time to cecum = 3.6 Withdrawal time = 10.9   The  scope was withdrawn and the procedure completed. COMPLICATIONS: There were no immediate complications.  ENDOSCOPIC IMPRESSION: 1.   Sessile polyp was found in the descending colon; polypectomy was performed with a cold snare 2.   The examination was otherwise normal  RECOMMENDATIONS: 1.  Await pathology results 2.  Timing of repeat colonoscopy will be determined by pathology findings. 3.  You will receive a letter within 1-2 weeks with the results of your biopsy as well as final recommendations.  Please call my office if you have not received a letter after 3 weeks.  eSigned:  Jerene Bears, MD 09/16/2015 11:43 AM   cc:  the patient, PCP

## 2015-09-16 NOTE — Patient Instructions (Signed)
Discharge instructions given. Handout on polyps. Resume previous medications. YOU HAD AN ENDOSCOPIC PROCEDURE TODAY AT THE [facility]:   Refer to the procedure report that was given to you for any specific questions about what was found during the examination.  If the procedure report does not answer your questions, please call your gastroenterologist to clarify.  If you requested that your care partner not be given the details of your procedure findings, then the procedure report has been included in a sealed envelope for you to review at your convenience later.  YOU SHOULD EXPECT: Some feelings of bloating in the abdomen. Passage of more gas than usual.  Walking can help get rid of the air that was put into your GI tract during the procedure and reduce the bloating. If you had a lower endoscopy (such as a colonoscopy or flexible sigmoidoscopy) you may notice spotting of blood in your stool or on the toilet paper. If you underwent a bowel prep for your procedure, you may not have a normal bowel movement for a few days.  Please Note:  You might notice some irritation and congestion in your nose or some drainage.  This is from the oxygen used during your procedure.  There is no need for concern and it should clear up in a day or so.  SYMPTOMS TO REPORT IMMEDIATELY:   Following lower endoscopy (colonoscopy or flexible sigmoidoscopy):  Excessive amounts of blood in the stool  Significant tenderness or worsening of abdominal pains  Swelling of the abdomen that is new, acute  Fever of 100F or higher   For urgent or emergent issues, a gastroenterologist can be reached at any hour by calling [phone].   DIET: Your first meal following the procedure should be a small meal and then it is ok to progress to your normal diet. Heavy or fried foods are harder to digest and may make you feel nauseous or bloated.  Likewise, meals heavy in dairy and vegetables can increase bloating.  Drink  plenty of fluids but you should avoid alcoholic beverages for 24 hours.  ACTIVITY:  You should plan to take it easy for the rest of today and you should NOT DRIVE or use heavy machinery until tomorrow (because of the sedation medicines used during the test).    FOLLOW UP: Our staff will call the number listed on your records the next business day following your procedure to check on you and address any questions or concerns that you may have regarding the information given to you following your procedure. If we do not reach you, we will leave a message.  However, if you are feeling well and you are not experiencing any problems, there is no need to return our call.  We will assume that you have returned to your regular daily activities without incident.  If any biopsies were taken you will be contacted by phone or by letter within the next 1-3 weeks.  Please call us at [phone] if you have not heard about the biopsies in 3 weeks.    SIGNATURES/CONFIDENTIALITY: You and/or your care partner have signed paperwork which will be entered into your electronic medical record.  These signatures attest to the fact that that the information above on your After Visit Summary has been reviewed and is understood.  Full responsibility of the confidentiality of this discharge information lies with you and/or your care-partner. 

## 2015-09-16 NOTE — Progress Notes (Signed)
A/ox3 pleased with MAC, report to Celia RN 

## 2015-09-17 ENCOUNTER — Telehealth: Payer: Self-pay | Admitting: *Deleted

## 2015-09-17 NOTE — Telephone Encounter (Signed)
No answer, left message to call if questions or concerns. 

## 2015-09-25 ENCOUNTER — Encounter: Payer: Self-pay | Admitting: Internal Medicine

## 2015-11-11 ENCOUNTER — Other Ambulatory Visit: Payer: Self-pay

## 2015-11-11 DIAGNOSIS — Z1231 Encounter for screening mammogram for malignant neoplasm of breast: Secondary | ICD-10-CM

## 2015-12-23 ENCOUNTER — Ambulatory Visit
Admission: RE | Admit: 2015-12-23 | Discharge: 2015-12-23 | Disposition: A | Discharge status: Home / Self Care | Payer: BC Managed Care – PPO | Source: Ambulatory Visit

## 2015-12-23 DIAGNOSIS — Z1231 Encounter for screening mammogram for malignant neoplasm of breast: Secondary | ICD-10-CM

## 2015-12-31 ENCOUNTER — Encounter: Payer: BC Managed Care – PPO | Admitting: Internal Medicine

## 2016-01-21 ENCOUNTER — Telehealth: Payer: Self-pay

## 2016-01-21 MED ORDER — HYDROCHLOROTHIAZIDE 25 MG PO TABS: 25.0000 mg | ORAL_TABLET | Freq: Every day | ORAL | Status: DC

## 2016-01-21 NOTE — Telephone Encounter (Signed)
rf hctz to [facility] on [address].

## 2016-02-16 ENCOUNTER — Ambulatory Visit (INDEPENDENT_AMBULATORY_CARE_PROVIDER_SITE_OTHER): Payer: BC Managed Care – PPO | Admitting: Internal Medicine

## 2016-02-16 ENCOUNTER — Encounter: Payer: Self-pay | Admitting: Internal Medicine

## 2016-02-16 VITALS — BP 134/86 | HR 61 | Temp 98.4°F | Ht 69.0 in | Wt 208.0 lb

## 2016-02-16 DIAGNOSIS — C439 Malignant melanoma of skin, unspecified: Secondary | ICD-10-CM | POA: Diagnosis not present

## 2016-02-16 DIAGNOSIS — Z0000 Encounter for general adult medical examination without abnormal findings: Secondary | ICD-10-CM | POA: Diagnosis not present

## 2016-02-16 DIAGNOSIS — Z1159 Encounter for screening for other viral diseases: Secondary | ICD-10-CM | POA: Diagnosis not present

## 2016-02-16 DIAGNOSIS — R002 Palpitations: Secondary | ICD-10-CM | POA: Diagnosis not present

## 2016-02-16 DIAGNOSIS — Z23 Encounter for immunization: Secondary | ICD-10-CM | POA: Diagnosis not present

## 2016-02-16 DIAGNOSIS — I10 Essential (primary) hypertension: Secondary | ICD-10-CM

## 2016-02-16 MED ORDER — HYDROCHLOROTHIAZIDE 25 MG PO TABS: 25.0000 mg | ORAL_TABLET | Freq: Every day | ORAL | Status: DC

## 2016-02-16 NOTE — Assessment & Plan Note (Signed)
Sees derm annually 

## 2016-02-16 NOTE — Patient Instructions (Signed)
Test(s) ordered today. Your results will be released to MyChart (or called to you) after review, usually within 72hours after test completion. If any changes need to be made, you will be notified at that same time.  All other Health Maintenance issues reviewed.   All recommended immunizations and age-appropriate screenings are up-to-date or discussed.  Tetanus vaccine administered today.   Medications reviewed and updated.  No changes recommended at this time.  Your prescription(s) have been submitted to your pharmacy. Please take as directed and contact our office if you believe you are having problem(s) with the medication(s).  Please followup in one year  Health Maintenance, Female Adopting a healthy lifestyle and getting preventive care can go a long way to promote health and wellness. Talk with your health care provider about what schedule of regular examinations is right for you. This is a good chance for you to check in with your provider about disease prevention and staying healthy. In between checkups, there are plenty of things you can do on your own. Experts have done a lot of research about which lifestyle changes and preventive measures are most likely to keep you healthy. Ask your health care provider for more information. WEIGHT AND DIET  Eat a healthy diet  Be sure to include plenty of vegetables, fruits, low-fat dairy products, and lean protein.  Do not eat a lot of foods high in solid fats, added sugars, or salt.  Get regular exercise. This is one of the most important things you can do for your health.  Most adults should exercise for at least 150 minutes each week. The exercise should increase your heart rate and make you sweat (moderate-intensity exercise).  Most adults should also do strengthening exercises at least twice a week. This is in addition to the moderate-intensity exercise.  Maintain a healthy weight  Body mass index (BMI) is a measurement that can be  used to identify possible weight problems. It estimates body fat based on height and weight. Your health care provider can help determine your BMI and help you achieve or maintain a healthy weight.  For females 20 years of age and older:   A BMI below 18.5 is considered underweight.  A BMI of 18.5 to 24.9 is normal.  A BMI of 25 to 29.9 is considered overweight.  A BMI of 30 and above is considered obese.  Watch levels of cholesterol and blood lipids  You should start having your blood tested for lipids and cholesterol at 54 years of age, then have this test every 5 years.  You may need to have your cholesterol levels checked more often if:  Your lipid or cholesterol levels are high.  You are older than 54 years of age.  You are at high risk for heart disease.  CANCER SCREENING   Lung Cancer  Lung cancer screening is recommended for adults 55-80 years old who are at high risk for lung cancer because of a history of smoking.  A yearly low-dose CT scan of the lungs is recommended for people who:  Currently smoke.  Have quit within the past 15 years.  Have at least a 30-pack-year history of smoking. A pack year is smoking an average of one pack of cigarettes a day for 1 year.  Yearly screening should continue until it has been 15 years since you quit.  Yearly screening should stop if you develop a health problem that would prevent you from having lung cancer treatment.  Breast Cancer  Practice   breast self-awareness. This means understanding how your breasts normally appear and feel.  It also means doing regular breast self-exams. Let your health care provider know about any changes, no matter how small.  If you are in your 20s or 30s, you should have a clinical breast exam (CBE) by a health care provider every 1-3 years as part of a regular health exam.  If you are 40 or older, have a CBE every year. Also consider having a breast X-ray (mammogram) every year.  If  you have a family history of breast cancer, talk to your health care provider about genetic screening.  If you are at high risk for breast cancer, talk to your health care provider about having an MRI and a mammogram every year.  Breast cancer gene (BRCA) assessment is recommended for women who have family members with BRCA-related cancers. BRCA-related cancers include:  Breast.  Ovarian.  Tubal.  Peritoneal cancers.  Results of the assessment will determine the need for genetic counseling and BRCA1 and BRCA2 testing. Cervical Cancer Your health care provider may recommend that you be screened regularly for cancer of the pelvic organs (ovaries, uterus, and vagina). This screening involves a pelvic examination, including checking for microscopic changes to the surface of your cervix (Pap test). You may be encouraged to have this screening done every 3 years, beginning at age 21.  For women ages 30-65, health care providers may recommend pelvic exams and Pap testing every 3 years, or they may recommend the Pap and pelvic exam, combined with testing for human papilloma virus (HPV), every 5 years. Some types of HPV increase your risk of cervical cancer. Testing for HPV may also be done on women of any age with unclear Pap test results.  Other health care providers may not recommend any screening for nonpregnant women who are considered low risk for pelvic cancer and who do not have symptoms. Ask your health care provider if a screening pelvic exam is right for you.  If you have had past treatment for cervical cancer or a condition that could lead to cancer, you need Pap tests and screening for cancer for at least 20 years after your treatment. If Pap tests have been discontinued, your risk factors (such as having a new sexual partner) need to be reassessed to determine if screening should resume. Some women have medical problems that increase the chance of getting cervical cancer. In these cases,  your health care provider may recommend more frequent screening and Pap tests. Colorectal Cancer  This type of cancer can be detected and often prevented.  Routine colorectal cancer screening usually begins at 54 years of age and continues through 54 years of age.  Your health care provider may recommend screening at an earlier age if you have risk factors for colon cancer.  Your health care provider may also recommend using home test kits to check for hidden blood in the stool.  A small camera at the end of a tube can be used to examine your colon directly (sigmoidoscopy or colonoscopy). This is done to check for the earliest forms of colorectal cancer.  Routine screening usually begins at age 50.  Direct examination of the colon should be repeated every 5-10 years through 54 years of age. However, you may need to be screened more often if early forms of precancerous polyps or small growths are found. Skin Cancer  Check your skin from head to toe regularly.  Tell your health care provider about any new   moles or changes in moles, especially if there is a change in a mole's shape or color.  Also tell your health care provider if you have a mole that is larger than the size of a pencil eraser.  Always use sunscreen. Apply sunscreen liberally and repeatedly throughout the day.  Protect yourself by wearing long sleeves, pants, a wide-brimmed hat, and sunglasses whenever you are outside. HEART DISEASE, DIABETES, AND HIGH BLOOD PRESSURE   High blood pressure causes heart disease and increases the risk of stroke. High blood pressure is more likely to develop in:  People who have blood pressure in the high end of the normal range (130-139/85-89 mm Hg).  People who are overweight or obese.  People who are African American.  If you are 18-39 years of age, have your blood pressure checked every 3-5 years. If you are 40 years of age or older, have your blood pressure checked every year. You  should have your blood pressure measured twice--once when you are at a hospital or clinic, and once when you are not at a hospital or clinic. Record the average of the two measurements. To check your blood pressure when you are not at a hospital or clinic, you can use:  An automated blood pressure machine at a pharmacy.  A home blood pressure monitor.  If you are between 55 years and 79 years old, ask your health care provider if you should take aspirin to prevent strokes.  Have regular diabetes screenings. This involves taking a blood sample to check your fasting blood sugar level.  If you are at a normal weight and have a low risk for diabetes, have this test once every three years after 54 years of age.  If you are overweight and have a high risk for diabetes, consider being tested at a younger age or more often. PREVENTING INFECTION  Hepatitis B  If you have a higher risk for hepatitis B, you should be screened for this virus. You are considered at high risk for hepatitis B if:  You were born in a country where hepatitis B is common. Ask your health care provider which countries are considered high risk.  Your parents were born in a high-risk country, and you have not been immunized against hepatitis B (hepatitis B vaccine).  You have HIV or AIDS.  You use needles to inject street drugs.  You live with someone who has hepatitis B.  You have had sex with someone who has hepatitis B.  You get hemodialysis treatment.  You take certain medicines for conditions, including cancer, organ transplantation, and autoimmune conditions. Hepatitis C  Blood testing is recommended for:  Everyone born from 1945 through 1965.  Anyone with known risk factors for hepatitis C. Sexually transmitted infections (STIs)  You should be screened for sexually transmitted infections (STIs) including gonorrhea and chlamydia if:  You are sexually active and are younger than 54 years of age.  You  are older than 54 years of age and your health care provider tells you that you are at risk for this type of infection.  Your sexual activity has changed since you were last screened and you are at an increased risk for chlamydia or gonorrhea. Ask your health care provider if you are at risk.  If you do not have HIV, but are at risk, it may be recommended that you take a prescription medicine daily to prevent HIV infection. This is called pre-exposure prophylaxis (PrEP). You are considered at risk if:    You are sexually active and do not regularly use condoms or know the HIV status of your partner(s).  You take drugs by injection.  You are sexually active with a partner who has HIV. Talk with your health care provider about whether you are at high risk of being infected with HIV. If you choose to begin PrEP, you should first be tested for HIV. You should then be tested every 3 months for as long as you are taking PrEP.  PREGNANCY   If you are premenopausal and you may become pregnant, ask your health care provider about preconception counseling.  If you may become pregnant, take 400 to 800 micrograms (mcg) of folic acid every day.  If you want to prevent pregnancy, talk to your health care provider about birth control (contraception). OSTEOPOROSIS AND MENOPAUSE   Osteoporosis is a disease in which the bones lose minerals and strength with aging. This can result in serious bone fractures. Your risk for osteoporosis can be identified using a bone density scan.  If you are 65 years of age or older, or if you are at risk for osteoporosis and fractures, ask your health care provider if you should be screened.  Ask your health care provider whether you should take a calcium or vitamin D supplement to lower your risk for osteoporosis.  Menopause may have certain physical symptoms and risks.  Hormone replacement therapy may reduce some of these symptoms and risks. Talk to your health care  provider about whether hormone replacement therapy is right for you.  HOME CARE INSTRUCTIONS   Schedule regular health, dental, and eye exams.  Stay current with your immunizations.   Do not use any tobacco products including cigarettes, chewing tobacco, or electronic cigarettes.  If you are pregnant, do not drink alcohol.  If you are breastfeeding, limit how much and how often you drink alcohol.  Limit alcohol intake to no more than 1 drink per day for nonpregnant women. One drink equals 12 ounces of beer, 5 ounces of wine, or 1 ounces of hard liquor.  Do not use street drugs.  Do not share needles.  Ask your health care provider for help if you need support or information about quitting drugs.  Tell your health care provider if you often feel depressed.  Tell your health care provider if you have ever been abused or do not feel safe at home.   This information is not intended to replace advice given to you by your health care provider. Make sure you discuss any questions you have with your health care provider.   Document Released: 03/29/2011 Document Revised: 10/04/2014 Document Reviewed: 08/15/2013 Elsevier Interactive Patient Education 2016 Elsevier Inc.     

## 2016-02-16 NOTE — Progress Notes (Signed)
Subjective:    Patient ID: Caroline Wall, female    DOB: 29-Jun-1961, 55 y.o.   MRN: [id number]  HPI She is here to establish with a new pcp.  She is here for a physical exam.    She swims twice a week for one hour. She also does some walking when she can.  She has not been good with her diet recently and she needs to get better.    She is taking her blood pressure medication daily.  [facility] has been [facility].   She has palpitations.  She has always had palpitations, but they have increased recently.  She is in menopause.  They are not related to stress or caffeine intake. She has not associated symptoms.    Medications and allergies reviewed with patient and updated if appropriate.  Patient Active Problem List   Diagnosis Date Noted  . Depression   . Hypertension   . [facility])     Current Outpatient Prescriptions on File Prior to Visit  Medication Sig Dispense Refill  . Ascorbic Acid (VITAMIN C) 1000 MG tablet Take 1,000 mg by mouth daily.    Marland Kitchen aspirin 81 MG tablet Take 81 mg by mouth daily.    . cholecalciferol (VITAMIN D) 1000 UNITS tablet Take 1,000 Units by mouth daily.    . Multiple Vitamin (MULTIVITAMIN) tablet Take 1 tablet by mouth daily.    . NON FORMULARY     . Nutritional Supplements (ESTROVEN PO) Take by mouth daily.    . Omega-3 Fatty Acids (FISH OIL) 1000 MG CAPS Take by mouth daily.    Marland Kitchen Ubiquinol 300 MG CAPS Take by mouth daily.     No current facility-administered medications on file prior to visit.    Past Medical History  Diagnosis Date  . Depression     prior tx failure with sertraline and wellbutrin  . Hypertension   . Melanoma ([place]) 1996    L low back - follows annually [facility]  . GERD (gastroesophageal reflux disease)     in past  . Heart murmur   . BCC (basal cell carcinoma), leg 11/2014    following at [facility] derm for same    Past Surgical History  Procedure Laterality  Date  . Abdominal hysterectomy  2007  . Foot surgery      left  . Skin cancer excision      melanoma 1996, [place] 11/2014  . Tonsillectomy      Social History   Social History  . Marital Status: Single    Spouse Name: N/A  . Number of Children: 2  . Years of Education: N/A   Occupational History  . [employer]    Social History Main Topics  . Smoking status: Former Smoker    Types: Cigarettes    Quit date: 09/27/1989  . Smokeless tobacco: Never Used  . Alcohol Use: 5.4 - 6.0 oz/week    9-10 Glasses of wine per week  . Drug Use: No  . Sexual Activity: Not Asked   Other Topics Concern  . 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  Endometrial cancer Mother 60  . Hypertension Father   . Clotting disorder Father   . Atrial fibrillation Father   . Diabetes Paternal Grandmother   . Heart disease Paternal Grandmother 103  . Colon cancer Neg Hx   . Esophageal cancer Neg Hx   . Rectal cancer Neg Hx   . Stomach cancer Neg Hx     Review of Systems  Constitutional: Negative for fever, chills, appetite change and fatigue.  HENT: Negative for hearing loss.   Eyes: Negative for visual disturbance.  Respiratory: Negative for cough, shortness of breath and wheezing.   Cardiovascular: Positive for palpitations (more often, at least a couple of times a week - has had them , but they are more often). Negative for chest pain and leg swelling.  Gastrointestinal: Negative for nausea, abdominal pain, diarrhea, constipation and blood in stool.       No gerd  Genitourinary: Negative for dysuria and hematuria.  Musculoskeletal: Negative for myalgias, back pain and arthralgias.  Neurological: Negative for dizziness,  light-headedness and headaches.  Psychiatric/Behavioral: Negative for dysphoric mood. The patient is not nervous/anxious.        Objective:   Filed Vitals:   02/16/16 1456  BP: 134/86  Pulse: 61  Temp: 98.4 F (36.9 C)   Filed Weights   02/16/16 1456  Weight: 208 lb (94.348 kg)   Body mass index is 30.7 kg/(m^2).   Physical Exam Constitutional: She appears well-developed and well-nourished. No distress.  HENT:  Head: Normocephalic and atraumatic.  Right Ear: External ear normal. Normal ear canal and TM Left Ear: External ear normal.  Normal ear canal and TM Mouth/Throat: Oropharynx is clear and moist.  Eyes: Conjunctivae and EOM are normal.  Neck: Neck supple. No tracheal deviation present. No thyromegaly present.  No carotid bruit  Cardiovascular: Normal rate, regular rhythm and normal heart sounds.   No murmur heard.  No edema. Pulmonary/Chest: Effort normal and breath sounds normal. No respiratory distress. She has no wheezes. She has no rales.  Breast: deferred to Gyn Abdominal: Soft. She exhibits no distension. There is no tenderness.  Lymphadenopathy: She has no cervical adenopathy.  Skin: Skin is warm and dry. She is not diaphoretic.  Psychiatric: She has a normal mood and affect. Her behavior is normal.       Assessment & Plan:    Physical exam: Screening blood work ordered Immunizations tdap today, other up to date Colonoscopy  Up to date  Mammogram  Up to date  Gyn  Up to date  Eye exams - will schedule - due EKG - today for palpitations Exercise - exercising - discussed increasing exercise Weight - discussed weight loss, will work on weight loss Skin - follows with derm annually Substance abuse - none  See Problem List for Assessment and Plan of chronic medical problems.  Follow up in one year

## 2016-02-16 NOTE — Assessment & Plan Note (Signed)
BP well controlled Current regimen effective and well tolerated Continue current medications at current doses  

## 2016-02-16 NOTE — Assessment & Plan Note (Signed)
Brief, intermittent. She has always had them but they are slightly increased.  No associated symptoms Likely related to menopause Check EKG today Check blood work  Consider holter if they change, but they seem likely menopausal so we will hold off for now

## 2016-02-16 NOTE — Progress Notes (Signed)
Pre visit review using our clinic review tool, if applicable. No additional management support is needed unless otherwise documented below in the visit note. 

## 2016-03-11 ENCOUNTER — Other Ambulatory Visit (INDEPENDENT_AMBULATORY_CARE_PROVIDER_SITE_OTHER): Payer: BC Managed Care – PPO

## 2016-03-11 DIAGNOSIS — Z0000 Encounter for general adult medical examination without abnormal findings: Secondary | ICD-10-CM

## 2016-03-11 DIAGNOSIS — Z1159 Encounter for screening for other viral diseases: Secondary | ICD-10-CM

## 2016-03-11 LAB — CBC WITH DIFFERENTIAL/PLATELET
Basophils Absolute: 0 10*3/uL (ref 0.0–0.1)
Basophils Relative: 0.6 % (ref 0.0–3.0)
EOS PCT: 3.1 % (ref 0.0–5.0)
Eosinophils Absolute: 0.1 10*3/uL (ref 0.0–0.7)
HCT: 41.7 % (ref 36.0–46.0)
HEMOGLOBIN: 14.4 g/dL (ref 12.0–15.0)
Lymphocytes Relative: 31.5 % (ref 12.0–46.0)
Lymphs Abs: 1.5 10*3/uL (ref 0.7–4.0)
MCHC: 34.4 g/dL (ref 30.0–36.0)
MCV: 90 fl (ref 78.0–100.0)
MONO ABS: 0.5 10*3/uL (ref 0.1–1.0)
MONOS PCT: 10.1 % (ref 3.0–12.0)
Neutro Abs: 2.6 10*3/uL (ref 1.4–7.7)
Neutrophils Relative %: 54.7 % (ref 43.0–77.0)
Platelets: 203 10*3/uL (ref 150.0–400.0)
RBC: 4.64 Mil/uL (ref 3.87–5.11)
RDW: 12.9 % (ref 11.5–15.5)
WBC: 4.8 10*3/uL (ref 4.0–10.5)

## 2016-03-11 LAB — COMPREHENSIVE METABOLIC PANEL
ALBUMIN: 4.5 g/dL (ref 3.5–5.2)
ALK PHOS: 44 U/L (ref 39–117)
ALT: 25 U/L (ref 0–35)
AST: 19 U/L (ref 0–37)
BUN: 24 mg/dL — AB (ref 6–23)
CO2: 29 mEq/L (ref 19–32)
CREATININE: 0.95 mg/dL (ref 0.40–1.20)
Calcium: 9.5 mg/dL (ref 8.4–10.5)
Chloride: 100 mEq/L (ref 96–112)
GFR: 65.21 mL/min (ref >60.00)
Glucose, Bld: 101 mg/dL — ABNORMAL HIGH (ref 70–99)
POTASSIUM: 3.7 meq/L (ref 3.5–5.1)
SODIUM: 137 meq/L (ref 135–145)
TOTAL PROTEIN: 7.3 g/dL (ref 6.0–8.3)
Total Bilirubin: 0.6 mg/dL (ref 0.2–1.2)

## 2016-03-11 LAB — TSH: TSH: 3.29 u[IU]/mL (ref 0.35–4.50)

## 2016-03-11 LAB — HEPATITIS C ANTIBODY: HCV AB: NEGATIVE

## 2016-03-11 LAB — LIPID PANEL
CHOLESTEROL: 225 mg/dL — AB (ref 0–200)
HDL: 52.5 mg/dL (ref >39.00)
LDL Cholesterol: 152 mg/dL — ABNORMAL HIGH (ref 0–99)
NonHDL: 172.49
TRIGLYCERIDES: 104 mg/dL (ref 0.0–149.0)
Total CHOL/HDL Ratio: 4
VLDL: 20.8 mg/dL (ref 0.0–40.0)

## 2016-03-13 ENCOUNTER — Encounter: Payer: Self-pay | Admitting: Internal Medicine

## 2016-03-13 DIAGNOSIS — E785 Hyperlipidemia, unspecified: Secondary | ICD-10-CM | POA: Insufficient documentation

## 2016-03-13 DIAGNOSIS — E7800 Pure hypercholesterolemia, unspecified: Secondary | ICD-10-CM | POA: Insufficient documentation

## 2016-06-29 ENCOUNTER — Encounter: Payer: Self-pay | Admitting: Student

## 2016-11-09 ENCOUNTER — Other Ambulatory Visit: Payer: Self-pay | Admitting: Internal Medicine

## 2016-11-09 ENCOUNTER — Encounter: Payer: Self-pay | Admitting: Internal Medicine

## 2016-11-09 DIAGNOSIS — Z0000 Encounter for general adult medical examination without abnormal findings: Secondary | ICD-10-CM

## 2016-11-09 DIAGNOSIS — Z1231 Encounter for screening mammogram for malignant neoplasm of breast: Secondary | ICD-10-CM

## 2016-11-09 NOTE — Telephone Encounter (Signed)
Pt had last labs in 02/2016 , please advise.

## 2016-12-21 ENCOUNTER — Other Ambulatory Visit (INDEPENDENT_AMBULATORY_CARE_PROVIDER_SITE_OTHER): Payer: BC Managed Care – PPO

## 2016-12-21 DIAGNOSIS — Z0000 Encounter for general adult medical examination without abnormal findings: Secondary | ICD-10-CM | POA: Diagnosis not present

## 2016-12-21 LAB — CBC WITH DIFFERENTIAL/PLATELET
BASOS PCT: 1 % (ref 0.0–3.0)
Basophils Absolute: 0 10*3/uL (ref 0.0–0.1)
EOS ABS: 0.2 10*3/uL (ref 0.0–0.7)
EOS PCT: 3.8 % (ref 0.0–5.0)
HCT: 41.9 % (ref 36.0–46.0)
Hemoglobin: 14.6 g/dL (ref 12.0–15.0)
LYMPHS ABS: 1.6 10*3/uL (ref 0.7–4.0)
Lymphocytes Relative: 34 % (ref 12.0–46.0)
MCHC: 34.8 g/dL (ref 30.0–36.0)
MCV: 89.8 fl (ref 78.0–100.0)
MONO ABS: 0.4 10*3/uL (ref 0.1–1.0)
Monocytes Relative: 8.6 % (ref 3.0–12.0)
NEUTROS PCT: 52.6 % (ref 43.0–77.0)
Neutro Abs: 2.5 10*3/uL (ref 1.4–7.7)
PLATELETS: 198 10*3/uL (ref 150.0–400.0)
RBC: 4.67 Mil/uL (ref 3.87–5.11)
RDW: 13.1 % (ref 11.5–15.5)
WBC: 4.8 10*3/uL (ref 4.0–10.5)

## 2016-12-21 LAB — COMPREHENSIVE METABOLIC PANEL
ALT: 19 U/L (ref 0–35)
AST: 17 U/L (ref 0–37)
Albumin: 4.4 g/dL (ref 3.5–5.2)
Alkaline Phosphatase: 42 U/L (ref 39–117)
BUN: 17 mg/dL (ref 6–23)
CO2: 31 mEq/L (ref 19–32)
Calcium: 9.5 mg/dL (ref 8.4–10.5)
Chloride: 100 mEq/L (ref 96–112)
Creatinine, Ser: 0.89 mg/dL (ref 0.40–1.20)
GFR: 70.1 mL/min (ref >60.00)
Glucose, Bld: 100 mg/dL — ABNORMAL HIGH (ref 70–99)
Potassium: 4 mEq/L (ref 3.5–5.1)
Sodium: 139 mEq/L (ref 135–145)
Total Bilirubin: 0.6 mg/dL (ref 0.2–1.2)
Total Protein: 6.9 g/dL (ref 6.0–8.3)

## 2016-12-21 LAB — LIPID PANEL
CHOL/HDL RATIO: 4
CHOLESTEROL: 198 mg/dL (ref 0–200)
HDL: 50.2 mg/dL (ref >39.00)
LDL Cholesterol: 131 mg/dL — ABNORMAL HIGH (ref 0–99)
NONHDL: 147.81
Triglycerides: 84 mg/dL (ref 0.0–149.0)
VLDL: 16.8 mg/dL (ref 0.0–40.0)

## 2016-12-21 LAB — TSH: TSH: 4.57 u[IU]/mL — ABNORMAL HIGH (ref 0.35–4.50)

## 2016-12-23 ENCOUNTER — Encounter: Payer: Self-pay | Admitting: Internal Medicine

## 2017-01-04 ENCOUNTER — Ambulatory Visit
Admission: RE | Admit: 2017-01-04 | Discharge: 2017-01-04 | Disposition: A | Discharge status: Home / Self Care | Payer: BC Managed Care – PPO | Source: Ambulatory Visit | Attending: Internal Medicine | Admitting: Internal Medicine

## 2017-01-04 DIAGNOSIS — Z1231 Encounter for screening mammogram for malignant neoplasm of breast: Secondary | ICD-10-CM

## 2017-02-14 NOTE — Progress Notes (Signed)
Subjective:    Patient ID: Caroline Wall, female    DOB: May 03, 1961, 56 y.o.   MRN: [id number]  HPI She is here for a physical exam.   Left elbow is weak.  If she tries to lift something it is sore. It is mild and she has not needed to take anything.   Her BP has been low and she has been taking her medication less often.  Her BP has been 108/69.  Her BP was 117-120/ ?.    She is exercising regularly.   Between her 4-5th toes on her left foot red, itchy.  She tried an otc anti-fungal cream and it clears up.     Medications and allergies reviewed with patient and updated if appropriate.  Patient Active Problem List   Diagnosis Date Noted  . Hyperlipidemia 03/13/2016  . Palpitations 02/16/2016  . Hypertension   . Melanoma [facility])     Current Outpatient Prescriptions on File Prior to Visit  Medication Sig Dispense Refill  . Ascorbic Acid (VITAMIN C) 1000 MG tablet Take 1,000 mg by mouth daily.    Marland Kitchen aspirin 81 MG tablet Take 81 mg by mouth daily.    . cholecalciferol (VITAMIN D) 1000 UNITS tablet Take 1,000 Units by mouth daily.    . hydrochlorothiazide (HYDRODIURIL) 25 MG tablet Take 1 tablet (25 mg total) by mouth daily. 90 tablet 3  . Multiple Vitamin (MULTIVITAMIN) tablet Take 1 tablet by mouth daily.    . NON FORMULARY     . Nutritional Supplements (ESTROVEN PO) Take by mouth daily.    . Omega-3 Fatty Acids (FISH OIL) 1000 MG CAPS Take by mouth daily.     No current facility-administered medications on file prior to visit.     Past Medical History:  Diagnosis Date  . BCC (basal cell carcinoma), leg 11/2014   following at [facility] derm for same  . Depression    prior tx failure with sertraline and wellbutrin  . Depression   . GERD (gastroesophageal reflux disease)    in past  . Heart murmur   . Hypertension   . Melanoma ([place]) 1996   L low back - follows annually for body scan at [facility]    Past Surgical History:  Procedure Laterality Date  . ABDOMINAL  HYSTERECTOMY  2007  . FOOT SURGERY     left  . SKIN CANCER EXCISION     melanoma 1996, [place] 11/2014  . TONSILLECTOMY      Social History   Social History  . Marital status: Single    Spouse name: N/A  . Number of children: 2  . Years of education: N/A   Occupational History  . [employer]   Social History Main Topics  . Smoking status: Former Smoker    Types: Cigarettes    Quit date: 09/27/1989  . Smokeless tobacco: Never Used  . Alcohol use 5.4 - 6.0 oz/week    9 - 10 Glasses of wine per week  . Drug use: No  . Sexual activity: Not on file   Other Topics Concern  . 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 Endometrial cancer Mother 3  .  Hypertension Father   . Clotting disorder Father   . Atrial fibrillation Father   . Diabetes Paternal Grandmother   . Heart disease Paternal Grandmother 24  . Colon cancer Neg Hx   . Esophageal cancer Neg Hx   . Rectal cancer Neg Hx   . Stomach cancer Neg Hx     Review of Systems  Constitutional: Negative for appetite change, chills, fatigue, fever and unexpected weight change.  Eyes: Negative for visual disturbance.  Respiratory: Negative for cough, shortness of breath and wheezing.   Cardiovascular: Positive for palpitations (occasional). Negative for chest pain and leg swelling.  Gastrointestinal: Negative for abdominal pain, blood in stool, constipation, diarrhea and nausea.       No gerd  Genitourinary: Negative for dysuria and hematuria.  Musculoskeletal: Negative for arthralgias and back pain.  Skin: Negative for color change and rash.  Neurological: Negative for light-headedness and headaches.    Psychiatric/Behavioral: Negative for dysphoric mood. The patient is not nervous/anxious.        Objective:   Vitals:   02/15/17 1409  BP: 120/78  Pulse: (!) 58  Resp: 16  Temp: 98 F (36.7 C)   Filed Weights   02/15/17 1409  Weight: 197 lb (89.4 kg)   Body mass index is 29.09 kg/m.  Wt Readings from Last 3 Encounters:  02/15/17 197 lb (89.4 kg)  02/16/16 208 lb (94.3 kg)  09/16/15 204 lb (92.5 kg)     Physical Exam Constitutional: She appears well-developed and well-nourished. No distress.  HENT:  Head: Normocephalic and atraumatic.  Right Ear: External ear normal. Normal ear canal and TM Left Ear: External ear normal.  Normal ear canal and TM Mouth/Throat: Oropharynx is clear and moist.  Eyes: Conjunctivae and EOM are normal.  Neck: Neck supple. No tracheal deviation present. No thyromegaly present.  No carotid bruit  Cardiovascular: Normal rate, regular rhythm and normal heart sounds.   No murmur heard.  No edema. Pulmonary/Chest: Effort normal and breath sounds normal. No respiratory distress. She has no wheezes. She has no rales.  Breast: deferred to Gyn Abdominal: Soft. She exhibits no distension. There is no tenderness.  Lymphadenopathy: She has no cervical adenopathy.  Skin: Skin is warm and dry. She is not diaphoretic.  Psychiatric: She has a normal mood and affect. Her behavior is normal.         Assessment & Plan:   Physical exam: Screening blood work  Reviewed, repeat tsh ordered Immunizations    Up to date  Colonoscopy  Up to date  Mammogram  Up to date  Gyn  Up to date - appt in June Eye exams  Up to date  EKG  Done 01/2016 Exercise - regular Weight - has lost 11 lbs in the past year, continue weight loss efforts Skin   Goes to [facility] annually Substance abuse  none  See Problem List for Assessment and Plan of chronic medical problems.  FU annually

## 2017-02-14 NOTE — Patient Instructions (Addendum)
Normal BP 100-120 / 60-80.  Your goal is < 130/80.  Test(s) ordered today. Your results will be released to [facility] (or called to you) after review, usually within 72hours after test completion. If any changes need to be made, you will be notified at that same time.  All other Health Maintenance issues reviewed.   All recommended immunizations and age-appropriate screenings are up-to-date or discussed.  No immunizations administered today.   Medications reviewed and updated.  No changes recommended at this time.  Your prescription(s) have been submitted to your pharmacy. Please take as directed and contact our office if you believe you are having problem(s) with the medication(s).   Please followup in one year for a physical   Health Maintenance, Female Adopting a healthy lifestyle and getting preventive care can go a long way to promote health and wellness. Talk with your health care provider about what schedule of regular examinations is right for you. This is a good chance for you to check in with your provider about disease prevention and staying healthy. In between checkups, there are plenty of things you can do on your own. Experts have done a lot of research about which lifestyle changes and preventive measures are most likely to keep you healthy. Ask your health care provider for more information. Weight and diet Eat a healthy diet  Be sure to include plenty of vegetables, fruits, low-fat dairy products, and lean protein.  Do not eat a lot of foods high in solid fats, added sugars, or salt.  Get regular exercise. This is one of the most important things you can do for your health.  Most adults should exercise for at least 150 minutes each week. The exercise should increase your heart rate and make you sweat (moderate-intensity exercise).  Most adults should also do strengthening exercises at least twice a week. This is in addition to the moderate-intensity exercise. Maintain a  healthy weight  Body mass index (BMI) is a measurement that can be used to identify possible weight problems. It estimates body fat based on height and weight. Your health care provider can help determine your BMI and help you achieve or maintain a healthy weight.  For females 25 years of age and older:  A BMI below 18.5 is considered underweight.  A BMI of 18.5 to 24.9 is normal.  A BMI of 25 to 29.9 is considered overweight.  A BMI of 30 and above is considered obese. Watch levels of cholesterol and blood lipids  You should start having your blood tested for lipids and cholesterol at 55 years of age, then have this test every 5 years.  You may need to have your cholesterol levels checked more often if:  Your lipid or cholesterol levels are high.  You are older than 55 years of age.  You are at high risk for heart disease. Cancer screening Lung Cancer  Lung cancer screening is recommended for adults 54-46 years old who are at high risk for lung cancer because of a history of smoking.  A yearly low-dose CT scan of the lungs is recommended for people who:  Currently smoke.  Have quit within the past 15 years.  Have at least a 30-pack-year history of smoking. A pack year is smoking an average of one pack of cigarettes a day for 1 year.  Yearly screening should continue until it has been 15 years since you quit.  Yearly screening should stop if you develop a health problem that would prevent you  from having lung cancer treatment. Breast Cancer  Practice breast self-awareness. This means understanding how your breasts normally appear and feel.  It also means doing regular breast self-exams. Let your health care provider know about any changes, no matter how small.  If you are in your 20s or 30s, you should have a clinical breast exam (CBE) by a health care provider every 1-3 years as part of a regular health exam.  If you are 74 or older, have a CBE every year. Also  consider having a breast X-ray (mammogram) every year.  If you have a family history of breast cancer, talk to your health care provider about genetic screening.  If you are at high risk for breast cancer, talk to your health care provider about having an MRI and a mammogram every year.  Breast cancer gene (BRCA) assessment is recommended for women who have family members with BRCA-related cancers. BRCA-related cancers include:  Breast.  Ovarian.  Tubal.  Peritoneal cancers.  Results of the assessment will determine the need for genetic counseling and BRCA1 and BRCA2 testing. Cervical Cancer  Your health care provider may recommend that you be screened regularly for cancer of the pelvic organs (ovaries, uterus, and vagina). This screening involves a pelvic examination, including checking for microscopic changes to the surface of your cervix (Pap test). You may be encouraged to have this screening done every 3 years, beginning at age 36.  For women ages 33-65, health care providers may recommend pelvic exams and Pap testing every 3 years, or they may recommend the Pap and pelvic exam, combined with testing for human papilloma virus (HPV), every 5 years. Some types of HPV increase your risk of cervical cancer. Testing for HPV may also be done on women of any age with unclear Pap test results.  Other health care providers may not recommend any screening for nonpregnant women who are considered low risk for pelvic cancer and who do not have symptoms. Ask your health care provider if a screening pelvic exam is right for you.  If you have had past treatment for cervical cancer or a condition that could lead to cancer, you need Pap tests and screening for cancer for at least 20 years after your treatment. If Pap tests have been discontinued, your risk factors (such as having a new sexual partner) need to be reassessed to determine if screening should resume. Some women have medical problems that  increase the chance of getting cervical cancer. In these cases, your health care provider may recommend more frequent screening and Pap tests. Colorectal Cancer  This type of cancer can be detected and often prevented.  Routine colorectal cancer screening usually begins at 55 years of age and continues through 55 years of age.  Your health care provider may recommend screening at an earlier age if you have risk factors for colon cancer.  Your health care provider may also recommend using home test kits to check for hidden blood in the stool.  A small camera at the end of a tube can be used to examine your colon directly (sigmoidoscopy or colonoscopy). This is done to check for the earliest forms of colorectal cancer.  Routine screening usually begins at age 11.  Direct examination of the colon should be repeated every 5-10 years through 55 years of age. However, you may need to be screened more often if early forms of precancerous polyps or small growths are found. Skin Cancer  Check your skin from head to toe  regularly.  Tell your health care provider about any new moles or changes in moles, especially if there is a change in a mole's shape or color.  Also tell your health care provider if you have a mole that is larger than the size of a pencil eraser.  Always use sunscreen. Apply sunscreen liberally and repeatedly throughout the day.  Protect yourself by wearing long sleeves, pants, a wide-brimmed hat, and sunglasses whenever you are outside. Heart disease, diabetes, and high blood pressure  High blood pressure causes heart disease and increases the risk of stroke. High blood pressure is more likely to develop in:  People who have blood pressure in the high end of the normal range (130-139/85-89 mm Hg).  People who are overweight or obese.  People who are African American.  If you are 54-24 years of age, have your blood pressure checked every 3-5 years. If you are 28 years of  age or older, have your blood pressure checked every year. You should have your blood pressure measured twice-once when you are at a hospital or clinic, and once when you are not at a hospital or clinic. Record the average of the two measurements. To check your blood pressure when you are not at a hospital or clinic, you can use:  An automated blood pressure machine at a pharmacy.  A home blood pressure monitor.  If you are between 59 years and 83 years old, ask your health care provider if you should take aspirin to prevent strokes.  Have regular diabetes screenings. This involves taking a blood sample to check your fasting blood sugar level.  If you are at a normal weight and have a low risk for diabetes, have this test once every three years after 55 years of age.  If you are overweight and have a high risk for diabetes, consider being tested at a younger age or more often. Preventing infection Hepatitis B  If you have a higher risk for hepatitis B, you should be screened for this virus. You are considered at high risk for hepatitis B if:  You were born in a country where hepatitis B is common. Ask your health care provider which countries are considered high risk.  Your parents were born in a high-risk country, and you have not been immunized against hepatitis B (hepatitis B vaccine).  You have HIV or AIDS.  You use needles to inject street drugs.  You live with someone who has hepatitis B.  You have had sex with someone who has hepatitis B.  You get hemodialysis treatment.  You take certain medicines for conditions, including cancer, organ transplantation, and autoimmune conditions. Hepatitis C  Blood testing is recommended for:  Everyone born from [birth year] through 1964.  Anyone with known risk factors for hepatitis C. Sexually transmitted infections (STIs)  You should be screened for sexually transmitted infections (STIs) including gonorrhea and chlamydia if:  You are  sexually active and are younger than 55 years of age.  You are older than 55 years of age and your health care provider tells you that you are at risk for this type of infection.  Your sexual activity has changed since you were last screened and you are at an increased risk for chlamydia or gonorrhea. Ask your health care provider if you are at risk.  If you do not have HIV, but are at risk, it may be recommended that you take a prescription medicine daily to prevent HIV infection. This is called pre-exposure  prophylaxis (PrEP). You are considered at risk if:  You are sexually active and do not regularly use condoms or know the HIV status of your partner(s).  You take drugs by injection.  You are sexually active with a partner who has HIV. Talk with your health care provider about whether you are at high risk of being infected with HIV. If you choose to begin PrEP, you should first be tested for HIV. You should then be tested every 3 months for as long as you are taking PrEP. Pregnancy  If you are premenopausal and you may become pregnant, ask your health care provider about preconception counseling.  If you may become pregnant, take 400 to 800 micrograms (mcg) of folic acid every day.  If you want to prevent pregnancy, talk to your health care provider about birth control (contraception). Osteoporosis and menopause  Osteoporosis is a disease in which the bones lose minerals and strength with aging. This can result in serious bone fractures. Your risk for osteoporosis can be identified using a bone density scan.  If you are 7 years of age or older, or if you are at risk for osteoporosis and fractures, ask your health care provider if you should be screened.  Ask your health care provider whether you should take a calcium or vitamin D supplement to lower your risk for osteoporosis.  Menopause may have certain physical symptoms and risks.  Hormone replacement therapy may reduce some of  these symptoms and risks. Talk to your health care provider about whether hormone replacement therapy is right for you. Follow these instructions at home:  Schedule regular health, dental, and eye exams.  Stay current with your immunizations.  Do not use any tobacco products including cigarettes, chewing tobacco, or electronic cigarettes.  If you are pregnant, do not drink alcohol.  If you are breastfeeding, limit how much and how often you drink alcohol.  Limit alcohol intake to no more than 1 drink per day for nonpregnant women. One drink equals 12 ounces of beer, 5 ounces of wine, or 1 ounces of hard liquor.  Do not use street drugs.  Do not share needles.  Ask your health care provider for help if you need support or information about quitting drugs.  Tell your health care provider if you often feel depressed.  Tell your health care provider if you have ever been abused or do not feel safe at home. This information is not intended to replace advice given to you by your health care provider. Make sure you discuss any questions you have with your health care provider. Document Released: 03/29/2011 Document Revised: 02/19/2016 Document Reviewed: 06/17/2015 Elsevier Interactive Patient Education  2017 [employer].

## 2017-02-15 ENCOUNTER — Ambulatory Visit (INDEPENDENT_AMBULATORY_CARE_PROVIDER_SITE_OTHER): Payer: BC Managed Care – PPO | Admitting: Internal Medicine

## 2017-02-15 ENCOUNTER — Other Ambulatory Visit (INDEPENDENT_AMBULATORY_CARE_PROVIDER_SITE_OTHER): Payer: BC Managed Care – PPO

## 2017-02-15 ENCOUNTER — Encounter: Payer: Self-pay | Admitting: Internal Medicine

## 2017-02-15 VITALS — BP 120/78 | HR 58 | Temp 98.0°F | Resp 16 | Ht 69.0 in | Wt 197.0 lb

## 2017-02-15 DIAGNOSIS — Z0000 Encounter for general adult medical examination without abnormal findings: Secondary | ICD-10-CM

## 2017-02-15 DIAGNOSIS — R946 Abnormal results of thyroid function studies: Secondary | ICD-10-CM

## 2017-02-15 DIAGNOSIS — R7989 Other specified abnormal findings of blood chemistry: Secondary | ICD-10-CM

## 2017-02-15 DIAGNOSIS — I10 Essential (primary) hypertension: Secondary | ICD-10-CM

## 2017-02-15 DIAGNOSIS — E7800 Pure hypercholesterolemia, unspecified: Secondary | ICD-10-CM | POA: Diagnosis not present

## 2017-02-15 HISTORY — DX: Other specified abnormal findings of blood chemistry: R79.89

## 2017-02-15 LAB — TSH: TSH: 5.23 u[IU]/mL — ABNORMAL HIGH (ref 0.35–4.50)

## 2017-02-15 MED ORDER — ESTROVEN ENERGY PO TABS: ORAL_TABLET | ORAL | Status: DC

## 2017-02-15 MED ORDER — HYDROCHLOROTHIAZIDE 25 MG PO TABS: 25.0000 mg | ORAL_TABLET | Freq: Every day | ORAL | 3 refills | Status: DC

## 2017-02-15 NOTE — Assessment & Plan Note (Signed)
BP well controlled Current regimen effective and well tolerated Continue current medications - she will try 12.5 mg daily Monitor at home

## 2017-02-15 NOTE — Assessment & Plan Note (Signed)
Cholesterol improved with weight loss Continue regular exercise Continue weight loss efforts

## 2017-02-15 NOTE — Assessment & Plan Note (Signed)
Recheck tsh

## 2017-02-16 ENCOUNTER — Other Ambulatory Visit: Payer: Self-pay | Admitting: Internal Medicine

## 2017-02-16 DIAGNOSIS — R7989 Other specified abnormal findings of blood chemistry: Secondary | ICD-10-CM

## 2017-03-18 ENCOUNTER — Other Ambulatory Visit (INDEPENDENT_AMBULATORY_CARE_PROVIDER_SITE_OTHER): Payer: BC Managed Care – PPO

## 2017-03-18 DIAGNOSIS — R946 Abnormal results of thyroid function studies: Secondary | ICD-10-CM

## 2017-03-18 DIAGNOSIS — R7989 Other specified abnormal findings of blood chemistry: Secondary | ICD-10-CM

## 2017-03-18 LAB — T3, FREE: T3, Free: 3.3 pg/mL (ref 2.3–4.2)

## 2017-03-18 LAB — TSH: TSH: 3.71 u[IU]/mL (ref 0.35–4.50)

## 2017-03-18 LAB — T4, FREE: Free T4: 0.71 ng/dL (ref 0.60–1.60)

## 2017-03-21 LAB — THYROID ANTIBODIES
THYROID PEROXIDASE ANTIBODY: 55 [IU]/mL — AB (ref <9)
Thyroglobulin Ab: 1 IU/mL (ref <2)

## 2017-03-22 ENCOUNTER — Encounter: Payer: Self-pay | Admitting: Internal Medicine

## 2017-03-22 DIAGNOSIS — R768 Other specified abnormal immunological findings in serum: Secondary | ICD-10-CM | POA: Insufficient documentation

## 2017-03-22 DIAGNOSIS — R7689 Other specified abnormal immunological findings in serum: Secondary | ICD-10-CM | POA: Insufficient documentation

## 2017-03-24 ENCOUNTER — Other Ambulatory Visit: Payer: Self-pay | Admitting: Emergency Medicine

## 2017-03-24 MED ORDER — LEVOTHYROXINE SODIUM 25 MCG PO TABS: 25.0000 ug | ORAL_TABLET | Freq: Every day | ORAL | 1 refills | Status: DC

## 2017-03-24 MED ORDER — LEVOTHYROXINE SODIUM 25 MCG PO CAPS: 25.0000 ug | ORAL_CAPSULE | Freq: Every day | ORAL | 1 refills | Status: DC

## 2017-05-06 ENCOUNTER — Other Ambulatory Visit (INDEPENDENT_AMBULATORY_CARE_PROVIDER_SITE_OTHER): Payer: BC Managed Care – PPO

## 2017-05-06 DIAGNOSIS — R768 Other specified abnormal immunological findings in serum: Secondary | ICD-10-CM

## 2017-05-06 DIAGNOSIS — R760 Raised antibody titer: Secondary | ICD-10-CM

## 2017-05-06 LAB — TSH: TSH: 2.76 u[IU]/mL (ref 0.35–4.50)

## 2017-05-07 ENCOUNTER — Encounter: Payer: Self-pay | Admitting: Internal Medicine

## 2017-06-20 ENCOUNTER — Ambulatory Visit: Payer: Self-pay | Admitting: Internal Medicine

## 2017-09-11 ENCOUNTER — Other Ambulatory Visit: Payer: Self-pay | Admitting: Internal Medicine

## 2017-11-22 ENCOUNTER — Encounter: Payer: Self-pay | Admitting: Internal Medicine

## 2017-11-22 ENCOUNTER — Other Ambulatory Visit: Payer: Self-pay | Admitting: Internal Medicine

## 2017-11-22 DIAGNOSIS — R7989 Other specified abnormal findings of blood chemistry: Secondary | ICD-10-CM

## 2017-11-22 DIAGNOSIS — E785 Hyperlipidemia, unspecified: Secondary | ICD-10-CM

## 2017-11-22 DIAGNOSIS — Z139 Encounter for screening, unspecified: Secondary | ICD-10-CM

## 2017-11-22 DIAGNOSIS — I10 Essential (primary) hypertension: Secondary | ICD-10-CM

## 2018-01-05 ENCOUNTER — Ambulatory Visit
Admission: RE | Admit: 2018-01-05 | Discharge: 2018-01-05 | Disposition: A | Discharge status: Home / Self Care | Payer: BC Managed Care – PPO | Source: Ambulatory Visit | Attending: Internal Medicine | Admitting: Internal Medicine

## 2018-01-05 DIAGNOSIS — Z139 Encounter for screening, unspecified: Secondary | ICD-10-CM

## 2018-01-12 ENCOUNTER — Encounter: Payer: Self-pay | Admitting: Internal Medicine

## 2018-02-16 ENCOUNTER — Other Ambulatory Visit (INDEPENDENT_AMBULATORY_CARE_PROVIDER_SITE_OTHER): Payer: BC Managed Care – PPO

## 2018-02-16 ENCOUNTER — Encounter: Payer: Self-pay | Admitting: Internal Medicine

## 2018-02-16 DIAGNOSIS — I10 Essential (primary) hypertension: Secondary | ICD-10-CM | POA: Diagnosis not present

## 2018-02-16 DIAGNOSIS — R7989 Other specified abnormal findings of blood chemistry: Secondary | ICD-10-CM

## 2018-02-16 DIAGNOSIS — E785 Hyperlipidemia, unspecified: Secondary | ICD-10-CM

## 2018-02-16 DIAGNOSIS — E039 Hypothyroidism, unspecified: Secondary | ICD-10-CM | POA: Insufficient documentation

## 2018-02-16 LAB — CBC WITH DIFFERENTIAL/PLATELET
BASOS ABS: 0 10*3/uL (ref 0.0–0.1)
Basophils Relative: 0.8 % (ref 0.0–3.0)
EOS ABS: 0.4 10*3/uL (ref 0.0–0.7)
Eosinophils Relative: 8 % — ABNORMAL HIGH (ref 0.0–5.0)
HCT: 41.2 % (ref 36.0–46.0)
Hemoglobin: 14.5 g/dL (ref 12.0–15.0)
LYMPHS ABS: 1.6 10*3/uL (ref 0.7–4.0)
Lymphocytes Relative: 31.8 % (ref 12.0–46.0)
MCHC: 35.2 g/dL (ref 30.0–36.0)
MCV: 89.5 fl (ref 78.0–100.0)
MONOS PCT: 9.1 % (ref 3.0–12.0)
Monocytes Absolute: 0.4 10*3/uL (ref 0.1–1.0)
NEUTROS ABS: 2.5 10*3/uL (ref 1.4–7.7)
Neutrophils Relative %: 50.3 % (ref 43.0–77.0)
PLATELETS: 181 10*3/uL (ref 150.0–400.0)
RBC: 4.61 Mil/uL (ref 3.87–5.11)
RDW: 13.1 % (ref 11.5–15.5)
WBC: 4.9 10*3/uL (ref 4.0–10.5)

## 2018-02-16 LAB — COMPREHENSIVE METABOLIC PANEL
ALBUMIN: 4.5 g/dL (ref 3.5–5.2)
ALT: 20 U/L (ref 0–35)
AST: 19 U/L (ref 0–37)
Alkaline Phosphatase: 39 U/L (ref 39–117)
BUN: 17 mg/dL (ref 6–23)
CALCIUM: 9.7 mg/dL (ref 8.4–10.5)
CHLORIDE: 102 meq/L (ref 96–112)
CO2: 30 meq/L (ref 19–32)
Creatinine, Ser: 0.88 mg/dL (ref 0.40–1.20)
GFR: 70.72 mL/min (ref >60.00)
Glucose, Bld: 99 mg/dL (ref 70–99)
Potassium: 4 mEq/L (ref 3.5–5.1)
Sodium: 139 mEq/L (ref 135–145)
Total Bilirubin: 0.5 mg/dL (ref 0.2–1.2)
Total Protein: 7 g/dL (ref 6.0–8.3)

## 2018-02-16 LAB — TSH: TSH: 2.65 u[IU]/mL (ref 0.35–4.50)

## 2018-02-16 LAB — LIPID PANEL
Cholesterol: 191 mg/dL (ref 0–200)
HDL: 58.5 mg/dL (ref >39.00)
LDL Cholesterol: 117 mg/dL — ABNORMAL HIGH (ref 0–99)
NonHDL: 132.85
Total CHOL/HDL Ratio: 3
Triglycerides: 77 mg/dL (ref 0.0–149.0)
VLDL: 15.4 mg/dL (ref 0.0–40.0)

## 2018-02-16 NOTE — Patient Instructions (Addendum)
All other Health Maintenance issues reviewed.   All recommended immunizations and age-appropriate screenings are up-to-date or discussed.  No immunizations administered today.   Medications reviewed and updated.  No changes recommended at this time.  Your prescription(s) have been submitted to your pharmacy. Please take as directed and contact our office if you believe you are having problem(s) with the medication(s).   Please followup in one year   Health Maintenance, Female Adopting a healthy lifestyle and getting preventive care can go a long way to promote health and wellness. Talk with your health care provider about what schedule of regular examinations is right for you. This is a good chance for you to check in with your provider about disease prevention and staying healthy. In between checkups, there are plenty of things you can do on your own. Experts have done a lot of research about which lifestyle changes and preventive measures are most likely to keep you healthy. Ask your health care provider for more information. Weight and diet Eat a healthy diet  Be sure to include plenty of vegetables, fruits, low-fat dairy products, and lean protein.  Do not eat a lot of foods high in solid fats, added sugars, or salt.  Get regular exercise. This is one of the most important things you can do for your health. ? Most adults should exercise for at least 150 minutes each week. The exercise should increase your heart rate and make you sweat (moderate-intensity exercise). ? Most adults should also do strengthening exercises at least twice a week. This is in addition to the moderate-intensity exercise.  Maintain a healthy weight  Body mass index (BMI) is a measurement that can be used to identify possible weight problems. It estimates body fat based on height and weight. Your health care provider can help determine your BMI and help you achieve or maintain a healthy weight.  For females  59 years of age and older: ? A BMI below 18.5 is considered underweight. ? A BMI of 18.5 to 24.9 is normal. ? A BMI of 25 to 29.9 is considered overweight. ? A BMI of 30 and above is considered obese.  Watch levels of cholesterol and blood lipids  You should start having your blood tested for lipids and cholesterol at 56 years of age, then have this test every 5 years.  You may need to have your cholesterol levels checked more often if: ? Your lipid or cholesterol levels are high. ? You are older than 56 years of age. ? You are at high risk for heart disease.  Cancer screening Lung Cancer  Lung cancer screening is recommended for adults 52-20 years old who are at high risk for lung cancer because of a history of smoking.  A yearly low-dose CT scan of the lungs is recommended for people who: ? Currently smoke. ? Have quit within the past 15 years. ? Have at least a 30-pack-year history of smoking. A pack year is smoking an average of one pack of cigarettes a day for 1 year.  Yearly screening should continue until it has been 15 years since you quit.  Yearly screening should stop if you develop a health problem that would prevent you from having lung cancer treatment.  Breast Cancer  Practice breast self-awareness. This means understanding how your breasts normally appear and feel.  It also means doing regular breast self-exams. Let your health care provider know about any changes, no matter how small.  If you are in your 32s  or 72s, you should have a clinical breast exam (CBE) by a health care provider every 1-3 years as part of a regular health exam.  If you are 68 or older, have a CBE every year. Also consider having a breast X-ray (mammogram) every year.  If you have a family history of breast cancer, talk to your health care provider about genetic screening.  If you are at high risk for breast cancer, talk to your health care provider about having an MRI and a mammogram  every year.  Breast cancer gene (BRCA) assessment is recommended for women who have family members with BRCA-related cancers. BRCA-related cancers include: ? Breast. ? Ovarian. ? Tubal. ? Peritoneal cancers.  Results of the assessment will determine the need for genetic counseling and BRCA1 and BRCA2 testing.  Cervical Cancer Your health care provider may recommend that you be screened regularly for cancer of the pelvic organs (ovaries, uterus, and vagina). This screening involves a pelvic examination, including checking for microscopic changes to the surface of your cervix (Pap test). You may be encouraged to have this screening done every 3 years, beginning at age 40.  For women ages 36-65, health care providers may recommend pelvic exams and Pap testing every 3 years, or they may recommend the Pap and pelvic exam, combined with testing for human papilloma virus (HPV), every 5 years. Some types of HPV increase your risk of cervical cancer. Testing for HPV may also be done on women of any age with unclear Pap test results.  Other health care providers may not recommend any screening for nonpregnant women who are considered low risk for pelvic cancer and who do not have symptoms. Ask your health care provider if a screening pelvic exam is right for you.  If you have had past treatment for cervical cancer or a condition that could lead to cancer, you need Pap tests and screening for cancer for at least 20 years after your treatment. If Pap tests have been discontinued, your risk factors (such as having a new sexual partner) need to be reassessed to determine if screening should resume. Some women have medical problems that increase the chance of getting cervical cancer. In these cases, your health care provider may recommend more frequent screening and Pap tests.  Colorectal Cancer  This type of cancer can be detected and often prevented.  Routine colorectal cancer screening usually begins at  56 years of age and continues through 56 years of age.  Your health care provider may recommend screening at an earlier age if you have risk factors for colon cancer.  Your health care provider may also recommend using home test kits to check for hidden blood in the stool.  A small camera at the end of a tube can be used to examine your colon directly (sigmoidoscopy or colonoscopy). This is done to check for the earliest forms of colorectal cancer.  Routine screening usually begins at age 79.  Direct examination of the colon should be repeated every 5-10 years through 56 years of age. However, you may need to be screened more often if early forms of precancerous polyps or small growths are found.  Skin Cancer  Check your skin from head to toe regularly.  Tell your health care provider about any new moles or changes in moles, especially if there is a change in a mole's shape or color.  Also tell your health care provider if you have a mole that is larger than the size of a  pencil eraser.  Always use sunscreen. Apply sunscreen liberally and repeatedly throughout the day.  Protect yourself by wearing long sleeves, pants, a wide-brimmed hat, and sunglasses whenever you are outside.  Heart disease, diabetes, and high blood pressure  High blood pressure causes heart disease and increases the risk of stroke. High blood pressure is more likely to develop in: ? People who have blood pressure in the high end of the normal range (130-139/85-89 mm Hg). ? People who are overweight or obese. ? People who are African American.  If you are 67-37 years of age, have your blood pressure checked every 3-5 years. If you are 25 years of age or older, have your blood pressure checked every year. You should have your blood pressure measured twice-once when you are at a hospital or clinic, and once when you are not at a hospital or clinic. Record the average of the two measurements. To check your blood  pressure when you are not at a hospital or clinic, you can use: ? An automated blood pressure machine at a pharmacy. ? A home blood pressure monitor.  If you are between 35 years and 38 years old, ask your health care provider if you should take aspirin to prevent strokes.  Have regular diabetes screenings. This involves taking a blood sample to check your fasting blood sugar level. ? If you are at a normal weight and have a low risk for diabetes, have this test once every three years after 56 years of age. ? If you are overweight and have a high risk for diabetes, consider being tested at a younger age or more often. Preventing infection Hepatitis B  If you have a higher risk for hepatitis B, you should be screened for this virus. You are considered at high risk for hepatitis B if: ? You were born in a country where hepatitis B is common. Ask your health care provider which countries are considered high risk. ? Your parents were born in a high-risk country, and you have not been immunized against hepatitis B (hepatitis B vaccine). ? You have HIV or AIDS. ? You use needles to inject street drugs. ? You live with someone who has hepatitis B. ? You have had sex with someone who has hepatitis B. ? You get hemodialysis treatment. ? You take certain medicines for conditions, including cancer, organ transplantation, and autoimmune conditions.  Hepatitis C  Blood testing is recommended for: ? Everyone born from [birth year] through 1964. ? Anyone with known risk factors for hepatitis C.  Sexually transmitted infections (STIs)  You should be screened for sexually transmitted infections (STIs) including gonorrhea and chlamydia if: ? You are sexually active and are younger than 56 years of age. ? You are older than 56 years of age and your health care provider tells you that you are at risk for this type of infection. ? Your sexual activity has changed since you were last screened and you are at an  increased risk for chlamydia or gonorrhea. Ask your health care provider if you are at risk.  If you do not have HIV, but are at risk, it may be recommended that you take a prescription medicine daily to prevent HIV infection. This is called pre-exposure prophylaxis (PrEP). You are considered at risk if: ? You are sexually active and do not regularly use condoms or know the HIV status of your partner(s). ? You take drugs by injection. ? You are sexually active with a partner who has HIV.  Talk with your health care provider about whether you are at high risk of being infected with HIV. If you choose to begin PrEP, you should first be tested for HIV. You should then be tested every 3 months for as long as you are taking PrEP. Pregnancy  If you are premenopausal and you may become pregnant, ask your health care provider about preconception counseling.  If you may become pregnant, take 400 to 800 micrograms (mcg) of folic acid every day.  If you want to prevent pregnancy, talk to your health care provider about birth control (contraception). Osteoporosis and menopause  Osteoporosis is a disease in which the bones lose minerals and strength with aging. This can result in serious bone fractures. Your risk for osteoporosis can be identified using a bone density scan.  If you are 82 years of age or older, or if you are at risk for osteoporosis and fractures, ask your health care provider if you should be screened.  Ask your health care provider whether you should take a calcium or vitamin D supplement to lower your risk for osteoporosis.  Menopause may have certain physical symptoms and risks.  Hormone replacement therapy may reduce some of these symptoms and risks. Talk to your health care provider about whether hormone replacement therapy is right for you. Follow these instructions at home:  Schedule regular health, dental, and eye exams.  Stay current with your immunizations.  Do not use  any tobacco products including cigarettes, chewing tobacco, or electronic cigarettes.  If you are pregnant, do not drink alcohol.  If you are breastfeeding, limit how much and how often you drink alcohol.  Limit alcohol intake to no more than 1 drink per day for nonpregnant women. One drink equals 12 ounces of beer, 5 ounces of wine, or 1 ounces of hard liquor.  Do not use street drugs.  Do not share needles.  Ask your health care provider for help if you need support or information about quitting drugs.  Tell your health care provider if you often feel depressed.  Tell your health care provider if you have ever been abused or do not feel safe at home. This information is not intended to replace advice given to you by your health care provider. Make sure you discuss any questions you have with your health care provider. Document Released: 03/29/2011 Document Revised: 02/19/2016 Document Reviewed: 06/17/2015 Elsevier Interactive Patient Education  [employer].

## 2018-02-16 NOTE — Progress Notes (Signed)
Subjective:    Patient ID: Caroline Wall, female    DOB: Jul 06, 1961, 57 y.o.   MRN: [id number]  HPI She is here for a physical exam.   She has no concerns and denies changes in her health since she was here last.    She is exercising regularly and working on weight loss.  She is getting a new in two weeks that will be less stressful and she looking forward to it.    Medications and allergies reviewed with patient and updated if appropriate.  Patient Active Problem List   Diagnosis Date Noted  . Hypothyroidism 02/16/2018  . 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 Hyperlipidemia 03/13/2016  . Palpitations 02/16/2016  . Basal cell carcinoma 12/31/2014  . Personal history of malignant melanoma 09/03/2013  . Hypertension     Current Outpatient Medications on File Prior to Visit  Medication Sig Dispense Refill  . Ascorbic Acid (VITAMIN C) 1000 MG tablet Take 1,000 mg by mouth daily.    Marland Kitchen aspirin 81 MG tablet Take 81 mg by mouth daily.    . cholecalciferol (VITAMIN D) 1000 UNITS tablet Take 1,000 Units by mouth daily.    . hydrochlorothiazide (HYDRODIURIL) 25 MG tablet Take 1 tablet (25 mg total) by mouth daily. 90 tablet 3  . levothyroxine (SYNTHROID, LEVOTHROID) 25 MCG tablet TAKE 1 TABLET BY MOUTH ONCE DAILY BEFORE BREAKFAST 90 tablet 1  . Misc Natural Products (ESTROVEN ENERGY) TABS Taking daily    . Multiple Vitamin (MULTIVITAMIN) tablet Take 1 tablet by mouth daily.    . NON FORMULARY     . Omega-3 Fatty Acids (FISH OIL) 1000 MG CAPS Take by mouth daily.     No current facility-administered medications on file prior to visit.     Past Medical History:  Diagnosis Date  . BCC (basal cell carcinoma), leg 11/2014   following at [facility] derm for same  . Depression    prior tx failure with sertraline and wellbutrin  . Depression   . Elevated TSH 02/15/2017  . GERD (gastroesophageal reflux disease)    in past  . Heart murmur   . Hypertension   . Melanoma ([place]) 1996   L low  back - follows annually for body scan at [facility]    Past Surgical History:  Procedure Laterality Date  . ABDOMINAL HYSTERECTOMY  2007  . FOOT SURGERY     left  . SKIN CANCER EXCISION     melanoma 1996, [place] 11/2014  . TONSILLECTOMY      Social History   Socioeconomic History  . Marital status: Single    Spouse name: Not on file  . Number of children: 2  . Years of education: Not on file  . Highest education level: Not on file  Occupational History  . Occupation: [employer]    Employer: [employer]  Social Needs  . Financial resource strain: Not on file  . Food insecurity:    Worry: Not on file    Inability: Not on file  . Transportation needs:    Medical: Not on file    Non-medical: Not on file  Tobacco Use  . Smoking status: Former Smoker    Types: Cigarettes    Last attempt to quit: 09/27/1989    Years since quitting: 28.4  . Smokeless tobacco: Never Used  Substance and Sexual Activity  . Alcohol use: Yes    Comment: diet tonic and gin - decreased intake  . Drug use:  No  . Sexual activity: Not on file  Lifestyle  . Physical activity:    Days per week: Not on file    Minutes per session: Not on file  . Stress: Not on file  Relationships  . Social connections:    Talks on phone: Not on file    Gets together: Not on file    Attends religious service: Not on file    Active member of club or organization: Not on file    Attends meetings of clubs or organizations: Not on file    Relationship status: Not on file  Other Topics Concern  . 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 Endometrial cancer Mother 83  . Hypertension Father   . Clotting disorder Father   .  Atrial fibrillation Father   . Diabetes Paternal Grandmother   . Heart disease Paternal Grandmother 28  . Colon cancer Neg Hx   . Esophageal cancer Neg Hx   . Rectal cancer Neg Hx   . Stomach cancer Neg Hx     Review of Systems  Constitutional: Negative for chills, fatigue and fever.  Eyes: Negative for visual disturbance.  Respiratory: Negative for cough, shortness of breath and wheezing.   Cardiovascular: Negative for chest pain, palpitations and leg swelling.  Gastrointestinal: Negative for abdominal pain, blood in stool, constipation, diarrhea and nausea.       No gerd  Genitourinary: Negative for dysuria and hematuria.  Musculoskeletal: Positive for arthralgias (mild, elbows, pinkies). Negative for back pain.  Skin: Negative for color change and rash.  Neurological: Negative for dizziness, light-headedness and headaches.  Psychiatric/Behavioral: Negative for dysphoric mood. The patient is not nervous/anxious.        Objective:   Vitals:   02/17/18 0910  BP: 128/76  Pulse: (!) 57  Resp: 16  Temp: 97.9 F (36.6 C)  SpO2: 98%   Filed Weights   02/17/18 0910  Weight: 199 lb (90.3 kg)   Body mass index is 29.39 kg/m.  Wt Readings from Last 3 Encounters:  02/17/18 199 lb (90.3 kg)  02/15/17 197 lb (89.4 kg)  02/16/16 208 lb (94.3 kg)     Physical Exam Constitutional: She appears well-developed and well-nourished. No distress.  HENT:  Head: Normocephalic and atraumatic.  Right Ear: External ear normal. Normal ear canal and TM Left Ear: External ear normal.  Normal ear canal and TM Mouth/Throat: Oropharynx is clear and moist.  Eyes: Conjunctivae and EOM are normal.  Neck: Neck supple. No tracheal deviation present. No thyromegaly present.  No carotid bruit  Cardiovascular: Normal rate, regular rhythm and normal heart sounds.   No murmur heard.  No edema. Pulmonary/Chest: Effort normal and breath sounds normal. No respiratory distress. She has no wheezes. She  has no rales.  Breast: deferred to Gyn Abdominal: Soft. She exhibits no distension. There is no tenderness.  Lymphadenopathy: She has no cervical adenopathy.  Skin: Skin is warm and dry. She is not diaphoretic.  Psychiatric: She has a normal mood and affect. Her behavior is normal.        Assessment & Plan:   Physical exam: Screening blood work  reviewed Immunizations    Discussed shingrix, others up to date  Colonoscopy    Up to date  Mammogram    Up to date  Gyn  Up to date Eye exams   Up to date  EKG    Done 01/2016 Exercise   Swimming, circuit training, yoga Weight  Working on weight loss Skin  No concerns, sees derm regularly Substance abuse   none  See Problem List for Assessment and Plan of chronic medical problems.    FU in one year

## 2018-02-17 ENCOUNTER — Ambulatory Visit (INDEPENDENT_AMBULATORY_CARE_PROVIDER_SITE_OTHER): Payer: BC Managed Care – PPO | Admitting: Internal Medicine

## 2018-02-17 ENCOUNTER — Encounter: Payer: Self-pay | Admitting: Internal Medicine

## 2018-02-17 VITALS — BP 128/76 | HR 57 | Temp 97.9°F | Resp 16 | Ht 69.0 in | Wt 199.0 lb

## 2018-02-17 DIAGNOSIS — E782 Mixed hyperlipidemia: Secondary | ICD-10-CM

## 2018-02-17 DIAGNOSIS — C4491 Basal cell carcinoma of skin, unspecified: Secondary | ICD-10-CM

## 2018-02-17 DIAGNOSIS — Z0000 Encounter for general adult medical examination without abnormal findings: Secondary | ICD-10-CM

## 2018-02-17 DIAGNOSIS — Z85820 Personal history of malignant melanoma of skin: Secondary | ICD-10-CM

## 2018-02-17 DIAGNOSIS — I10 Essential (primary) hypertension: Secondary | ICD-10-CM | POA: Diagnosis not present

## 2018-02-17 DIAGNOSIS — E063 Autoimmune thyroiditis: Secondary | ICD-10-CM

## 2018-02-17 DIAGNOSIS — E038 Other specified hypothyroidism: Secondary | ICD-10-CM

## 2018-02-17 MED ORDER — LEVOTHYROXINE SODIUM 25 MCG PO TABS
ORAL_TABLET | ORAL | 3 refills | Status: DC
Start: 2018-02-17 — End: 2019-01-15

## 2018-02-17 MED ORDER — HYDROCHLOROTHIAZIDE 25 MG PO TABS: 25.0000 mg | ORAL_TABLET | Freq: Every day | ORAL | 3 refills | Status: DC

## 2018-02-17 NOTE — Assessment & Plan Note (Signed)
BP well controlled Current regimen effective and well tolerated Continue current medications at current doses cmp reviewed and normal

## 2018-02-17 NOTE — Assessment & Plan Note (Signed)
Sees derm regularly.

## 2018-02-17 NOTE — Assessment & Plan Note (Signed)
Lipids well controlled Continue healthy diet, exercise and weight loss efforts

## 2018-02-17 NOTE — Assessment & Plan Note (Signed)
tsh in normal range Continue current dose of medication 

## 2018-02-17 NOTE — Assessment & Plan Note (Signed)
Seeing derm regularly.

## 2018-04-03 ENCOUNTER — Telehealth: Payer: Self-pay | Admitting: Internal Medicine

## 2018-04-03 NOTE — Telephone Encounter (Signed)
Patient's spouse, Curt Bears, dropped off a form to be signed by Dr Quay Burow.  Please call patient when it is ready to be picked up.  Placed in Dr Eilleen Kempf box for completion.

## 2018-04-03 NOTE — Telephone Encounter (Signed)
Spoke with pt to advise form was ready for pick up at front office.

## 2018-04-11 ENCOUNTER — Ambulatory Visit: Payer: BC Managed Care – PPO | Admitting: Family

## 2018-07-20 ENCOUNTER — Other Ambulatory Visit: Payer: Self-pay | Admitting: Obstetrics & Gynecology

## 2018-07-21 ENCOUNTER — Other Ambulatory Visit: Payer: Self-pay | Admitting: Obstetrics & Gynecology

## 2018-07-21 DIAGNOSIS — R5381 Other malaise: Secondary | ICD-10-CM

## 2018-07-26 ENCOUNTER — Other Ambulatory Visit: Payer: Self-pay | Admitting: Obstetrics & Gynecology

## 2018-07-26 DIAGNOSIS — Z780 Asymptomatic menopausal state: Secondary | ICD-10-CM

## 2018-07-26 DIAGNOSIS — E2839 Other primary ovarian failure: Secondary | ICD-10-CM

## 2018-08-14 ENCOUNTER — Ambulatory Visit
Admission: RE | Admit: 2018-08-14 | Discharge: 2018-08-14 | Disposition: A | Discharge status: Home / Self Care | Payer: Managed Care, Other (non HMO) | Source: Ambulatory Visit | Attending: Obstetrics & Gynecology | Admitting: Obstetrics & Gynecology

## 2018-08-14 DIAGNOSIS — Z780 Asymptomatic menopausal state: Secondary | ICD-10-CM

## 2018-08-14 DIAGNOSIS — E2839 Other primary ovarian failure: Secondary | ICD-10-CM

## 2018-12-04 NOTE — Progress Notes (Signed)
Subjective:    Patient ID: Caroline Wall, female    DOB: 03-03-1961, 58 y.o.   MRN: [id number]  HPI The patient is here for an acute visit.   Tenderness in left lower back near incision from previous surgery (melanoma removed 1998)- the pain started about 2 1/2 months ago and it got worse last month.  She denies injury.  She has been doing more weight lifting, but that has been since September.  It is on the left side.   The pain comes and goes.  It is uncomfortable and achy.  It feels different than muscle pain.  She denies radiation, numbness or tingling.   She tried advil once and it helped a little.  Nothing makes it worse.  It seems to be worse in the morning.  Movement and activity does not make the pain worse.   Rash in groin:  It started a few months ago.  She changed her soap because she had started a new soap and wondered if that was what it was from.  She tried triamcinolone and benadryl.   It does not itch.  It is red.  It has spread.  She thinks the triamcinolone cream may have made it less red, but does not think it really helped.  Fatigue: She does feel more tired and she is unsure if it is her thyroid or something else.  She has noticed that on her apple watch her oxygen level seems to vary at night-she is unsure if that is accurate or not.  She does snore.  She did knows any apnea as far she is aware.  She typically does not get enough sleep, but she thinks if she gets enough sleep her sleep is refreshing.  Has had lots of ticks bites - ? Related to current symptoms.   Medications and allergies reviewed with patient and updated if appropriate.  Patient Active Problem List   Diagnosis Date Noted  . Hypothyroidism 02/16/2018  . 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 Hyperlipidemia 03/13/2016  . Palpitations 02/16/2016  . Basal cell carcinoma 12/31/2014  . Personal history of malignant melanoma 09/03/2013  . Hypertension     Current Outpatient Medications on File  Prior to Visit  Medication Sig Dispense Refill  . Ascorbic Acid (VITAMIN C) 1000 MG tablet Take 1,000 mg by mouth daily.    . hydrochlorothiazide (HYDRODIURIL) 25 MG tablet Take 1 tablet (25 mg total) by mouth daily. 90 tablet 3  . levothyroxine (SYNTHROID, LEVOTHROID) 25 MCG tablet TAKE 1 TABLET BY MOUTH ONCE DAILY BEFORE BREAKFAST 90 tablet 3  . Multiple Vitamin (MULTIVITAMIN) tablet Take 1 tablet by mouth daily.    . Omega-3 Fatty Acids (FISH OIL) 1000 MG CAPS Take by mouth daily.     No current facility-administered medications on file prior to visit.     Past Medical History:  Diagnosis Date  . BCC (basal cell carcinoma), leg 11/2014   following at [facility] derm for same  . Depression    prior tx failure with sertraline and wellbutrin  . Depression   . Elevated TSH 02/15/2017  . GERD (gastroesophageal reflux disease)    in past  . Heart murmur   . Hypertension   . Melanoma ([place]) 1996   L low back - follows annually for body scan at [facility]    Past Surgical History:  Procedure Laterality Date  . ABDOMINAL HYSTERECTOMY  2007  . FOOT SURGERY     left  . SKIN  CANCER EXCISION     melanoma 1996, [place] 11/2014  . TONSILLECTOMY      Social History   Socioeconomic History  . Marital status: Married    Spouse name: Not on file  . Number of children: 2  . Years of education: Not on file  . Highest education level: Not on file  Occupational History  . Occupation: [employer]    Employer: [employer]  Social Needs  . Financial resource strain: Not on file  . Food insecurity:    Worry: Not on file    Inability: Not on file  . Transportation needs:    Medical: Not on file    Non-medical: Not on file  Tobacco Use  . Smoking status: Former Smoker    Types: Cigarettes    Last attempt to quit: 09/27/1989    Years since quitting: 29.2  . Smokeless tobacco: Never Used  Substance and Sexual Activity  . Alcohol use: Yes    Comment: diet tonic and gin -  decreased intake  . Drug use: No  . Sexual activity: Not on file  Lifestyle  . Physical activity:    Days per week: Not on file    Minutes per session: Not on file  . Stress: Not on file  Relationships  . Social connections:    Talks on phone: Not on file    Gets together: Not on file    Attends religious service: Not on file    Active member of club or organization: Not on file    Attends meetings of clubs or organizations: Not on file    Relationship status: Not on file  Other Topics Concern  . 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 Endometrial cancer Mother 67  . Hypertension Father   . Clotting disorder Father   . Atrial fibrillation Father   . Diabetes Paternal Grandmother   . Heart disease Paternal Grandmother 38  . Colon cancer Neg Hx   . Esophageal cancer Neg Hx   . Rectal cancer Neg Hx   . Stomach cancer Neg Hx     Review of Systems  Constitutional: Positive for fatigue. Negative for chills and fever.  Respiratory: Negative for cough, shortness of breath and wheezing.   Cardiovascular: Positive for palpitations (occasionally). Negative for chest pain and leg swelling.  Musculoskeletal: Positive for arthralgias (mild - fingers, elbows). Negative for myalgias.  Neurological: Negative for light-headedness and headaches.  Psychiatric/Behavioral: Negative for dysphoric mood.       Objective:   Vitals:   12/05/18 1538  BP: 126/80  Pulse: 65  Resp: 16  Temp: 98.4 F (36.9 C)  SpO2: 97%   BP Readings from Last 3 Encounters:  12/05/18 126/80  02/17/18 128/76  02/15/17 120/78   Wt Readings from Last 3 Encounters:  12/05/18 204 lb 12.8 oz (92.9 kg)  02/17/18 199 lb (90.3 kg)    02/15/17 197 lb (89.4 kg)   Body mass index is 30.24 kg/m.   Physical Exam Constitutional:      General: She is not in acute distress.    Appearance: Normal appearance. She is not ill-appearing.  HENT:     Head: Normocephalic and atraumatic.  Cardiovascular:     Rate and Rhythm: Normal rate and regular rhythm.  Pulmonary:     Effort: Pulmonary effort is normal.     Breath sounds: Normal breath sounds.  Musculoskeletal:     Comments: Left psoas muscle might be slightly more prominent than right.  Area of tenderness mid psoas-no fluctuance or mass.  Direct palpation of thoracic and lumbar spine revealed no tenderness or deformity.  Scar from prior melanoma removal left lower back healed and nontender  Skin:    General: Skin is warm and dry.     Findings: Rash (Bilateral anterior upper legs patchy, erythematous, slightly raised rash-likely tinea versicolor) present.  Neurological:     Mental Status: She is alert.            Assessment & Plan:    See Problem List for Assessment and Plan of chronic medical problems.

## 2018-12-05 ENCOUNTER — Other Ambulatory Visit (INDEPENDENT_AMBULATORY_CARE_PROVIDER_SITE_OTHER): Payer: Managed Care, Other (non HMO)

## 2018-12-05 ENCOUNTER — Ambulatory Visit (INDEPENDENT_AMBULATORY_CARE_PROVIDER_SITE_OTHER): Payer: Managed Care, Other (non HMO) | Admitting: Internal Medicine

## 2018-12-05 ENCOUNTER — Other Ambulatory Visit: Payer: Self-pay | Admitting: Internal Medicine

## 2018-12-05 ENCOUNTER — Encounter: Payer: Self-pay | Admitting: Internal Medicine

## 2018-12-05 ENCOUNTER — Telehealth: Payer: Self-pay | Admitting: Internal Medicine

## 2018-12-05 ENCOUNTER — Ambulatory Visit: Payer: Managed Care, Other (non HMO) | Admitting: Internal Medicine

## 2018-12-05 VITALS — BP 126/80 | HR 65 | Temp 98.4°F | Resp 16 | Ht 69.0 in | Wt 204.8 lb

## 2018-12-05 DIAGNOSIS — Z1231 Encounter for screening mammogram for malignant neoplasm of breast: Secondary | ICD-10-CM

## 2018-12-05 DIAGNOSIS — E063 Autoimmune thyroiditis: Secondary | ICD-10-CM | POA: Diagnosis not present

## 2018-12-05 DIAGNOSIS — B360 Pityriasis versicolor: Secondary | ICD-10-CM | POA: Diagnosis not present

## 2018-12-05 DIAGNOSIS — M545 Low back pain, unspecified: Secondary | ICD-10-CM

## 2018-12-05 DIAGNOSIS — R5383 Other fatigue: Secondary | ICD-10-CM | POA: Diagnosis not present

## 2018-12-05 DIAGNOSIS — E038 Other specified hypothyroidism: Secondary | ICD-10-CM | POA: Diagnosis not present

## 2018-12-05 DIAGNOSIS — Z85820 Personal history of malignant melanoma of skin: Secondary | ICD-10-CM

## 2018-12-05 LAB — CBC WITH DIFFERENTIAL/PLATELET
Basophils Absolute: 0 10*3/uL (ref 0.0–0.1)
Basophils Relative: 0.5 % (ref 0.0–3.0)
Eosinophils Absolute: 0.1 10*3/uL (ref 0.0–0.7)
Eosinophils Relative: 2.6 % (ref 0.0–5.0)
HEMATOCRIT: 40.7 % (ref 36.0–46.0)
Hemoglobin: 14.3 g/dL (ref 12.0–15.0)
Lymphocytes Relative: 36.9 % (ref 12.0–46.0)
Lymphs Abs: 1.9 10*3/uL (ref 0.7–4.0)
MCHC: 35.1 g/dL (ref 30.0–36.0)
MCV: 89 fl (ref 78.0–100.0)
Monocytes Absolute: 0.5 10*3/uL (ref 0.1–1.0)
Monocytes Relative: 9.2 % (ref 3.0–12.0)
NEUTROS ABS: 2.6 10*3/uL (ref 1.4–7.7)
NEUTROS PCT: 50.8 % (ref 43.0–77.0)
PLATELETS: 210 10*3/uL (ref 150.0–400.0)
RBC: 4.58 Mil/uL (ref 3.87–5.11)
RDW: 13.3 % (ref 11.5–15.5)
WBC: 5.1 10*3/uL (ref 4.0–10.5)

## 2018-12-05 LAB — COMPREHENSIVE METABOLIC PANEL
ALT: 21 U/L (ref 0–35)
AST: 18 U/L (ref 0–37)
Albumin: 4.6 g/dL (ref 3.5–5.2)
Alkaline Phosphatase: 44 U/L (ref 39–117)
BUN: 19 mg/dL (ref 6–23)
CO2: 27 mEq/L (ref 19–32)
Calcium: 9.7 mg/dL (ref 8.4–10.5)
Chloride: 100 mEq/L (ref 96–112)
Creatinine, Ser: 0.78 mg/dL (ref 0.40–1.20)
GFR: 76.26 mL/min (ref >60.00)
Glucose, Bld: 92 mg/dL (ref 70–99)
POTASSIUM: 3.4 meq/L — AB (ref 3.5–5.1)
Sodium: 138 mEq/L (ref 135–145)
Total Bilirubin: 0.5 mg/dL (ref 0.2–1.2)
Total Protein: 7.1 g/dL (ref 6.0–8.3)

## 2018-12-05 LAB — TSH: TSH: 4.76 u[IU]/mL — ABNORMAL HIGH (ref 0.35–4.50)

## 2018-12-05 MED ORDER — TERBINAFINE 1 % EX GEL: CUTANEOUS | 0 refills | Status: DC

## 2018-12-05 NOTE — Assessment & Plan Note (Signed)
Clinically euthyroid, but has some increased fatigue-could be related to many things Check tsh  Titrate med dose if needed

## 2018-12-05 NOTE — Patient Instructions (Addendum)
Your rash is likely tinea versicolor.  Use the cream for at least one week.   I will order imaging of your back - someone to schedule.     Have blood work done today.

## 2018-12-05 NOTE — Assessment & Plan Note (Signed)
More than 2 months left lower back pain-psoas region Pain is an achy, intermittent pain that is not typical of typical lower back pain, muscle pain It is close to her melanoma scar-melanoma removed 1998 Given the atypical nature of her pain and no obvious cause on exam we will need to pursue further imaging-MRI thoracic and lumbar spine

## 2018-12-05 NOTE — Telephone Encounter (Signed)
Please call her and let her know her kidney function, liver tests and blood counts are normal.  Thyroid function is slightly underactive and that could be causing some fatigue.  Increase thyroid dose to 50 mcg daily-she can take 2 pills of which she has at home until she runs out.  Recheck TSH in 2 months.   Let her know that she really does need to have an MRI of her lower back to evaluate her pain.  I have ordered this.  If she is concerned about being claustrophobic or overly anxious I can prescribe Valium for her to take before the MRI.  If she does want that she would not be able to drive herself.  Let me know.

## 2018-12-05 NOTE — Assessment & Plan Note (Signed)
As above.

## 2018-12-05 NOTE — Assessment & Plan Note (Signed)
Rash bilateral upper anterior legs Terbinafine gel twice daily for 1 week

## 2018-12-05 NOTE — Assessment & Plan Note (Signed)
Having increased fatigue ?  Related to thyroid-we will check TSH Check CBC, CMP to rule out other causes Apple Watch showed some variation in oxygenation at night-?  OSA versus nocturnal hypoxia She does snore, but feels her sleep is refreshing if she gets enough She does not get enough sleep and that could be contributing to her fatigue Discussed evaluation of sleep apnea-we will check blood work and consider that in the future Stressed getting 7-8 hours of sleep a night

## 2018-12-06 MED ORDER — DIAZEPAM 5 MG PO TABS: ORAL_TABLET | ORAL | 0 refills | Status: DC

## 2018-12-06 NOTE — Telephone Encounter (Signed)
Pt aware of results below as well as information about MRI. She expressed understanding and would like a valium rx. Will have someone take her.

## 2018-12-06 NOTE — Telephone Encounter (Signed)
Patient informed medication was sent and stated understanding. Informed her she will be getting a call to schedule that appointment but since it was placed yesterday it may not be till next week before she gets a call. Patient stated understanding

## 2018-12-06 NOTE — Telephone Encounter (Signed)
Valium sent to pof 

## 2018-12-07 ENCOUNTER — Encounter: Payer: Self-pay | Admitting: Internal Medicine

## 2018-12-14 ENCOUNTER — Encounter: Payer: Self-pay | Admitting: Internal Medicine

## 2019-01-02 ENCOUNTER — Other Ambulatory Visit: Payer: Self-pay

## 2019-01-02 DIAGNOSIS — M545 Low back pain, unspecified: Secondary | ICD-10-CM

## 2019-01-03 ENCOUNTER — Other Ambulatory Visit: Payer: Self-pay

## 2019-01-03 ENCOUNTER — Encounter: Payer: Self-pay | Admitting: Family Medicine

## 2019-01-03 ENCOUNTER — Ambulatory Visit (INDEPENDENT_AMBULATORY_CARE_PROVIDER_SITE_OTHER)
Admission: RE | Admit: 2019-01-03 | Discharge: 2019-01-03 | Disposition: A | Discharge status: Home / Self Care | Payer: Managed Care, Other (non HMO) | Source: Ambulatory Visit | Attending: Family Medicine | Admitting: Family Medicine

## 2019-01-03 ENCOUNTER — Ambulatory Visit (INDEPENDENT_AMBULATORY_CARE_PROVIDER_SITE_OTHER): Payer: Managed Care, Other (non HMO) | Admitting: Family Medicine

## 2019-01-03 DIAGNOSIS — M545 Low back pain, unspecified: Secondary | ICD-10-CM

## 2019-01-03 NOTE — Assessment & Plan Note (Addendum)
Patient has low back pain.  Patient's x-rays do not show any significant bony abnormality but mild degenerative disc disease.  We discussed with patient though there is also some gastrointestinal distention that could be contributing to some of the left-sided pain.  It does appear to be more muscular.  Discussed with patient at that point to try some home exercises, will start with over-the-counter medications, discussed worsening pain can try different medication such as muscle relaxers but hold at this moment.  Worsening discomfort pain, fevers chills or any abnormal weight loss MRI will be necessary secondary to the history of melanoma.  Patient is in agreement with the plan.  Follow-up with virtual visit in 3 weeks

## 2019-01-03 NOTE — Progress Notes (Signed)
[facility] [address] [place], [place] [address] Phone: [phone] Subjective:   [name], am serving as a scribe for Dr. Hulan Saas.  I'm seeing this patient by the request  of:  [name], MD   CC: Low back pain  [id number]  Caroline Wall is a 58 y.o. female coming in with complaint of back pain. Patient has been having dull, ache in lower left side, intermittently. Patient notes that she had surgery in the area where she is having pain for melanoma. Has tried NSAIDs.  Patient states that the anti-inflammatories do help.  Never seems to be completely debilitating but never without discomfort in the area.    Patient with past medical history significant for melanoma.  Patient did have x-rays lumbar spine.  X-rays independently visualized by me showing very mild degenerative disc disease but no significant abnormality noted  Past Medical History:  Diagnosis Date   BCC (basal cell carcinoma), leg 11/2014   following at [facility] derm for same   Depression    prior tx failure with sertraline and wellbutrin   Depression    Elevated TSH 02/15/2017   GERD (gastroesophageal reflux disease)    in past   Heart murmur    Hypertension    Melanoma ([place]) 1996   L low back - follows annually for body scan at [facility]   Past Surgical History:  Procedure Laterality Date   ABDOMINAL HYSTERECTOMY  2007   FOOT SURGERY     left   SKIN CANCER [place] 11/2014   TONSILLECTOMY     Social History   Socioeconomic History   Marital status: Married    Spouse name: Not on file   Number of children: 2   Years of education: Not on file   Highest education level: Not on file  Occupational History   Occupation: [employer]    Employer: [employer]  Social Needs   Financial resource strain: Not on file   Food insecurity:    Worry: Not on file    Inability: Not on file    Transportation needs:    Medical: Not on file    Non-medical: Not on file  Tobacco Use   Smoking status: Former Smoker    Types: Cigarettes    Last attempt to quit: 09/27/1989    Years since quitting: 29.2   Smokeless tobacco: Never Used  Substance and Sexual Activity   Alcohol use: Yes    Comment: diet tonic and gin - decreased intake   Drug use: No   Sexual activity: Not on file  Lifestyle   Physical activity:    Days per week: Not on file    Minutes per session: Not on file   Stress: Not on file  Relationships   Social connections:    Talks on phone: Not on file    Gets together: Not on file    Attends religious service: Not on file    Active member of club or organization: Not on file    Attends meetings of clubs or organizations: Not on file    Relationship status: Not on file  Other Topics Concern   Not on file  Social History Narrative   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 children   Allergies  Allergen Reactions   Codeine Rash   Family History  Problem Relation Age of Onset   Endometrial cancer Mother 53   Hypertension Father    Clotting disorder Father    Atrial fibrillation Father    Diabetes Paternal Grandmother    Heart disease Paternal Grandmother 90   Colon cancer Neg Hx    Esophageal cancer Neg Hx    Rectal cancer Neg Hx    Stomach cancer Neg Hx     Current Outpatient Medications (Endocrine & Metabolic):    levothyroxine (SYNTHROID, LEVOTHROID) 25 MCG tablet, TAKE 1 TABLET BY MOUTH ONCE DAILY BEFORE BREAKFAST  Current Outpatient Medications (Cardiovascular):    hydrochlorothiazide (HYDRODIURIL) 25 MG tablet, Take 1 tablet (25 mg total) by mouth daily.     Current Outpatient Medications (Other):    Ascorbic Acid (VITAMIN C) 1000 MG tablet,  Take 1,000 mg by mouth daily.   diazepam (VALIUM) 5 MG tablet, Take one hour prior to MRI, may repeat x 1 if needed   Multiple Vitamin (MULTIVITAMIN) tablet, Take 1 tablet by mouth daily.   Omega-3 Fatty Acids (FISH OIL) 1000 MG CAPS, Take by mouth daily.   Terbinafine 1 % GEL, Apply to rash twice daily for one week    Past medical history, social, surgical and family history all reviewed in electronic medical record.  No pertanent information unless stated regarding to the chief complaint.   Review of Systems:  No headache, visual changes, nausea, vomiting, diarrhea, constipation, dizziness, abdominal pain, skin rash, fevers, chills, night sweats, weight loss, swollen lymph nodes, body aches, joint swelling, muscle aches, chest pain, shortness of breath, mood changes.   Objective  Blood pressure 112/72, pulse 60, height 5\' 9"  (1.753 m), weight 208 lb (94.3 kg), SpO2 97 %.    General: No apparent distress alert and oriented x3 mood and affect normal, dressed appropriately.  HEENT: Pupils equal, extraocular movements intact  Respiratory: Patient's speak in full sentences and does not appear short of breath  Cardiovascular: No lower extremity edema, non tender, no erythema  Skin: Warm dry intact with no signs of infection or rash on extremities or on axial skeleton.  Abdomen: Soft nontender  Neuro: Cranial nerves II through XII are intact, neurovascularly intact in all extremities with 2+ DTRs and 2+ pulses.  Lymph: No lymphadenopathy of posterior or anterior cervical chain or axillae bilaterally.  Gait normal with good balance and coordination.  MSK:  Non tender with full range of motion and good stability and symmetric strength and tone of shoulders, elbows, wrist, hip, knee and ankles bilaterally.  Back Exam:  Inspection: Mild loss of lordosis and poor core strength Motion: Flexion 45 deg, Extension 45 deg, Side Bending to 45 deg bilaterally,  Rotation to 45 deg bilaterally  SLR  laying: Negative  XSLR laying: Negative  Palpable tenderness: Tender to palpation in the paraspinal musculature lumbar spine left greater than right.  Melanoma scar noted.  Seems to be more lateral than where patient's pain is.Marland Kitchen FABER: negative. Sensory change: Gross sensation intact to all lumbar and sacral dermatomes.  Reflexes: 2+ at both patellar tendons, 2+ at achilles tendons, Babinski's downgoing.  Strength at foot  Plantar-flexion: 5/5 Dorsi-flexion: 5/5 Eversion: 5/5 Inversion: 5/5  Leg strength  Quad: 5/5 Hamstring: 5/5 Hip flexor: 5/5 Hip abductors: 5/5   [id number]; 15 additional minutes spent for Therapeutic exercises as stated in above notes.  This included exercises focusing on stretching, strengthening, with significant focus  on eccentric aspects.   Long term goals include an improvement in range of motion, strength, endurance as well as avoiding reinjury. Patient's frequency would include in 1-2 times a day, 3-5 times a week for a duration of 6-12 weeks. Low back exercises that included:  Pelvic tilt/bracing instruction to focus on control of the pelvic girdle and lower abdominal muscles  Glute strengthening exercises, focusing on proper firing of the glutes without engaging the low back muscles Proper stretching techniques for maximum relief for the hamstrings, hip flexors, low back and some rotation where tolerated    Proper technique shown and discussed handout in great detail with ATC.  All questions were discussed and answered.     Impression and Recommendations:     This case required medical decision making of moderate complexity. The above documentation has been reviewed and is accurate and complete Lyndal Pulley, DO       Note: This dictation was prepared with Dragon dictation along with smaller phrase technology. Any transcriptional errors that result from this process are unintentional.

## 2019-01-03 NOTE — Patient Instructions (Signed)
Good to see you  Ice 20 minutes 2 times daily. Usually after activity and before bed. Exercises 3 times a week.  Over the counter get  Mirilax 17grams daily  Colace 100mg  daily  Continue the turmeric  Tart cherry extract any dose at night Vitamin D 2000IU daily  See me again virtual in 3 weeks

## 2019-01-14 ENCOUNTER — Other Ambulatory Visit: Payer: Self-pay | Admitting: Internal Medicine

## 2019-01-23 NOTE — Progress Notes (Signed)
[name] [place], [place] [address] Phone: [phone] Subjective:    Virtual Visit via Video Note  I connected with Caroline Wall on 01/23/19 at  2:15 PM EDT by a video enabled telemedicine application and verified that I am speaking with the correct person using two identifiers.   I discussed the limitations of evaluation and management by telemedicine and the availability of in person appointments. The patient expressed understanding and agreed to proceed.  Patient was sent home setting and I was in office for the virtual platform present   I discussed the assessment and treatment plan with the patient. The patient was provided an opportunity to ask questions and all were answered. The patient agreed with the plan and demonstrated an understanding of the instructions.   The patient was advised to call back or seek an in-person evaluation if the symptoms worsen or if the condition fails to improve as anticipated.  I provided 10 minutes of -face-to-face time during this encounter.   Lyndal Pulley, DO    CC: Back pain follow-up  MWU:[id number]  Caroline Wall is a 58 y.o. female coming in with complaint of low back pain.  Patient was seen previously.  Patient did have x-rays at last exam and go into more detail below.  Patient was doing medications for stool burden as well as start some home exercises for muscle imbalances.  Patient states she is feeling significantly better at this time.  Probably 50 to 60% better.  States that the stool is less hard and she has not had this decreased amount of pain in quite some time.  Doing the exercises occasionally.  Taking the vitamin supplementations.   Patient monitoring previously did show a large stool burden.  Pain though did not seem to be out of proportion.  Past Medical History:  Diagnosis Date  . BCC (basal cell carcinoma), leg 11/2014   following at [facility] derm for same  .  Depression    prior tx failure with sertraline and wellbutrin  . Depression   . Elevated TSH 02/15/2017  . GERD (gastroesophageal reflux disease)    in past  . Heart murmur   . Hypertension   . Melanoma ([place]) 1996   L low back - follows annually for body scan at [facility]   Past Surgical History:  Procedure Laterality Date  . ABDOMINAL HYSTERECTOMY  2007  . FOOT SURGERY     left  . SKIN CANCER EXCISION     melanoma 1996, [place] 11/2014  . TONSILLECTOMY     Social History   Socioeconomic History  . Marital status: Married    Spouse name: Not on file  . Number of children: 2  . Years of education: Not on file  . Highest education level: Not on file  Occupational History  . Occupation: [employer]    Employer: [employer]  Social Needs  . Financial resource strain: Not on file  . Food insecurity:    Worry: Not on file    Inability: Not on file  . Transportation needs:    Medical: Not on file    Non-medical: Not on file  Tobacco Use  . Smoking status: Former Smoker    Types: Cigarettes    Last attempt to quit: 09/27/1989    Years since quitting: 29.3  . Smokeless tobacco: Never Used  Substance and Sexual Activity  . Alcohol use: Yes    Comment:  diet tonic and gin - decreased intake  . Drug use: No  . Sexual activity: Not on file  Lifestyle  . Physical activity:    Days per week: Not on file    Minutes per session: Not on file  . Stress: Not on file  Relationships  . Social connections:    Talks on phone: Not on file    Gets together: Not on file    Attends religious service: Not on file    Active member of club or organization: Not on file    Attends meetings of clubs or organizations: Not on file    Relationship status: Not on file  Other Topics Concern  . 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 Codeine Rash   Family History  Problem Relation Age of Onset  . Endometrial cancer Mother 68  . Hypertension Father   . Clotting disorder Father   . Atrial fibrillation Father   . Diabetes Paternal Grandmother   . Heart disease Paternal Grandmother 61  . Colon cancer Neg Hx   . Esophageal cancer Neg Hx   . Rectal cancer Neg Hx   . Stomach cancer Neg Hx     Current Outpatient Medications (Endocrine & Metabolic):  .  levothyroxine (SYNTHROID) 25 MCG tablet, TAKE 1 TABLET BY MOUTH ONCE DAILY BEFORE BREAKFAST  Current Outpatient Medications (Cardiovascular):  .  hydrochlorothiazide (HYDRODIURIL) 25 MG tablet, Take 1 tablet (25 mg total) by mouth daily.     Current Outpatient Medications (Other):  Marland Kitchen  Ascorbic Acid (VITAMIN C) 1000 MG tablet, Take 1,000 mg by mouth daily. .  diazepam (VALIUM) 5 MG tablet, Take one hour prior to MRI, may repeat x 1 if needed .  Multiple Vitamin (MULTIVITAMIN) tablet, Take 1 tablet by mouth daily. .  Omega-3 Fatty Acids (FISH OIL) 1000 MG CAPS, Take by mouth daily. .  Terbinafine 1 % GEL, Apply to rash twice daily for one week    Past medical history, social, surgical and family history all reviewed in electronic medical record.  No pertanent information unless stated regarding to the chief complaint.   Review of Systems:  No headache, visual changes, nausea, vomiting, diarrhea, constipation, dizziness, abdominal pain, skin rash, fevers, chills, night sweats, weight loss, swollen lymph nodes, body aches, joint swelling, muscle aches, chest pain, shortness of breath, mood changes.   Objective     General: No apparent distress alert and oriented x3 mood and affect normal, dressed appropriately.  HEENT: Pupils equal, extraocular movements intact  Respiratory: Patient's speak in full sentences and does not appear short of breath       Impression and Recommendations:     This case required medical decision making of moderate complexity. The above documentation has been reviewed and is accurate and complete Lyndal Pulley, DO       Note: This dictation was prepared with Dragon dictation along with smaller phrase technology. Any transcriptional errors that result from this process are unintentional.

## 2019-01-24 ENCOUNTER — Encounter: Payer: Self-pay | Admitting: Family Medicine

## 2019-01-24 ENCOUNTER — Ambulatory Visit (INDEPENDENT_AMBULATORY_CARE_PROVIDER_SITE_OTHER): Payer: Managed Care, Other (non HMO) | Admitting: Family Medicine

## 2019-01-24 DIAGNOSIS — M545 Low back pain, unspecified: Secondary | ICD-10-CM

## 2019-01-24 NOTE — Assessment & Plan Note (Signed)
Pain seem to be partially more GI.  Responding well.  We discussed continuing the supplementations.  Follow-up again as needed

## 2019-02-20 ENCOUNTER — Encounter: Payer: BC Managed Care – PPO | Admitting: Internal Medicine

## 2019-02-24 NOTE — Patient Instructions (Addendum)
Tests ordered today. Your results will be released to MyChart (or called to you) after review, usually within 72hours after test completion. If any changes need to be made, you will be notified at that same time.  All other Health Maintenance issues reviewed.   All recommended immunizations and age-appropriate screenings are up-to-date or discussed.  No immunizations administered today.   Medications reviewed and updated.  Changes include :  none    Please followup in one year   Health Maintenance, Female Adopting a healthy lifestyle and getting preventive care can go a long way to promote health and wellness. Talk with your health care provider about what schedule of regular examinations is right for you. This is a good chance for you to check in with your provider about disease prevention and staying healthy. In between checkups, there are plenty of things you can do on your own. Experts have done a lot of research about which lifestyle changes and preventive measures are most likely to keep you healthy. Ask your health care provider for more information. Weight and diet Eat a healthy diet  Be sure to include plenty of vegetables, fruits, low-fat dairy products, and lean protein.  Do not eat a lot of foods high in solid fats, added sugars, or salt.  Get regular exercise. This is one of the most important things you can do for your health. ? Most adults should exercise for at least 150 minutes each week. The exercise should increase your heart rate and make you sweat (moderate-intensity exercise). ? Most adults should also do strengthening exercises at least twice a week. This is in addition to the moderate-intensity exercise. Maintain a healthy weight  Body mass index (BMI) is a measurement that can be used to identify possible weight problems. It estimates body fat based on height and weight. Your health care provider can help determine your BMI and help you achieve or maintain a  healthy weight.  For females 20 years of age and older: ? A BMI below 18.5 is considered underweight. ? A BMI of 18.5 to 24.9 is normal. ? A BMI of 25 to 29.9 is considered overweight. ? A BMI of 30 and above is considered obese. Watch levels of cholesterol and blood lipids  You should start having your blood tested for lipids and cholesterol at 57 years of age, then have this test every 5 years.  You may need to have your cholesterol levels checked more often if: ? Your lipid or cholesterol levels are high. ? You are older than 57 years of age. ? You are at high risk for heart disease. Cancer screening Lung Cancer  Lung cancer screening is recommended for adults 55-80 years old who are at high risk for lung cancer because of a history of smoking.  A yearly low-dose CT scan of the lungs is recommended for people who: ? Currently smoke. ? Have quit within the past 15 years. ? Have at least a 30-pack-year history of smoking. A pack year is smoking an average of one pack of cigarettes a day for 1 year.  Yearly screening should continue until it has been 15 years since you quit.  Yearly screening should stop if you develop a health problem that would prevent you from having lung cancer treatment. Breast Cancer  Practice breast self-awareness. This means understanding how your breasts normally appear and feel.  It also means doing regular breast self-exams. Let your health care provider know about any changes, no matter how small.    If you are in your 20s or 30s, you should have a clinical breast exam (CBE) by a health care provider every 1-3 years as part of a regular health exam.  If you are 40 or older, have a CBE every year. Also consider having a breast X-ray (mammogram) every year.  If you have a family history of breast cancer, talk to your health care provider about genetic screening.  If you are at high risk for breast cancer, talk to your health care provider about having  an MRI and a mammogram every year.  Breast cancer gene (BRCA) assessment is recommended for women who have family members with BRCA-related cancers. BRCA-related cancers include: ? Breast. ? Ovarian. ? Tubal. ? Peritoneal cancers.  Results of the assessment will determine the need for genetic counseling and BRCA1 and BRCA2 testing. Cervical Cancer Your health care provider may recommend that you be screened regularly for cancer of the pelvic organs (ovaries, uterus, and vagina). This screening involves a pelvic examination, including checking for microscopic changes to the surface of your cervix (Pap test). You may be encouraged to have this screening done every 3 years, beginning at age 21.  For women ages 30-65, health care providers may recommend pelvic exams and Pap testing every 3 years, or they may recommend the Pap and pelvic exam, combined with testing for human papilloma virus (HPV), every 5 years. Some types of HPV increase your risk of cervical cancer. Testing for HPV may also be done on women of any age with unclear Pap test results.  Other health care providers may not recommend any screening for nonpregnant women who are considered low risk for pelvic cancer and who do not have symptoms. Ask your health care provider if a screening pelvic exam is right for you.  If you have had past treatment for cervical cancer or a condition that could lead to cancer, you need Pap tests and screening for cancer for at least 20 years after your treatment. If Pap tests have been discontinued, your risk factors (such as having a new sexual partner) need to be reassessed to determine if screening should resume. Some women have medical problems that increase the chance of getting cervical cancer. In these cases, your health care provider may recommend more frequent screening and Pap tests. Colorectal Cancer  This type of cancer can be detected and often prevented.  Routine colorectal cancer screening  usually begins at 57 years of age and continues through 57 years of age.  Your health care provider may recommend screening at an earlier age if you have risk factors for colon cancer.  Your health care provider may also recommend using home test kits to check for hidden blood in the stool.  A small camera at the end of a tube can be used to examine your colon directly (sigmoidoscopy or colonoscopy). This is done to check for the earliest forms of colorectal cancer.  Routine screening usually begins at age 50.  Direct examination of the colon should be repeated every 5-10 years through 57 years of age. However, you may need to be screened more often if early forms of precancerous polyps or small growths are found. Skin Cancer  Check your skin from head to toe regularly.  Tell your health care provider about any new moles or changes in moles, especially if there is a change in a mole's shape or color.  Also tell your health care provider if you have a mole that is larger than the   size of a pencil eraser.  Always use sunscreen. Apply sunscreen liberally and repeatedly throughout the day.  Protect yourself by wearing long sleeves, pants, a wide-brimmed hat, and sunglasses whenever you are outside. Heart disease, diabetes, and high blood pressure  High blood pressure causes heart disease and increases the risk of stroke. High blood pressure is more likely to develop in: ? People who have blood pressure in the high end of the normal range (130-139/85-89 mm Hg). ? People who are overweight or obese. ? People who are African American.  If you are 18-39 years of age, have your blood pressure checked every 3-5 years. If you are 40 years of age or older, have your blood pressure checked every year. You should have your blood pressure measured twice-once when you are at a hospital or clinic, and once when you are not at a hospital or clinic. Record the average of the two measurements. To check your  blood pressure when you are not at a hospital or clinic, you can use: ? An automated blood pressure machine at a pharmacy. ? A home blood pressure monitor.  If you are between 55 years and 79 years old, ask your health care provider if you should take aspirin to prevent strokes.  Have regular diabetes screenings. This involves taking a blood sample to check your fasting blood sugar level. ? If you are at a normal weight and have a low risk for diabetes, have this test once every three years after 57 years of age. ? If you are overweight and have a high risk for diabetes, consider being tested at a younger age or more often. Preventing infection Hepatitis B  If you have a higher risk for hepatitis B, you should be screened for this virus. You are considered at high risk for hepatitis B if: ? You were born in a country where hepatitis B is common. Ask your health care provider which countries are considered high risk. ? Your parents were born in a high-risk country, and you have not been immunized against hepatitis B (hepatitis B vaccine). ? You have HIV or AIDS. ? You use needles to inject street drugs. ? You live with someone who has hepatitis B. ? You have had sex with someone who has hepatitis B. ? You get hemodialysis treatment. ? You take certain medicines for conditions, including cancer, organ transplantation, and autoimmune conditions. Hepatitis C  Blood testing is recommended for: ? Everyone born from 1945 through 1965. ? Anyone with known risk factors for hepatitis C. Sexually transmitted infections (STIs)  You should be screened for sexually transmitted infections (STIs) including gonorrhea and chlamydia if: ? You are sexually active and are younger than 57 years of age. ? You are older than 57 years of age and your health care provider tells you that you are at risk for this type of infection. ? Your sexual activity has changed since you were last screened and you are at an  increased risk for chlamydia or gonorrhea. Ask your health care provider if you are at risk.  If you do not have HIV, but are at risk, it may be recommended that you take a prescription medicine daily to prevent HIV infection. This is called pre-exposure prophylaxis (PrEP). You are considered at risk if: ? You are sexually active and do not regularly use condoms or know the HIV status of your partner(s). ? You take drugs by injection. ? You are sexually active with a partner who has HIV.   Talk with your health care provider about whether you are at high risk of being infected with HIV. If you choose to begin PrEP, you should first be tested for HIV. You should then be tested every 3 months for as long as you are taking PrEP. Pregnancy  If you are premenopausal and you may become pregnant, ask your health care provider about preconception counseling.  If you may become pregnant, take 400 to 800 micrograms (mcg) of folic acid every day.  If you want to prevent pregnancy, talk to your health care provider about birth control (contraception). Osteoporosis and menopause  Osteoporosis is a disease in which the bones lose minerals and strength with aging. This can result in serious bone fractures. Your risk for osteoporosis can be identified using a bone density scan.  If you are 65 years of age or older, or if you are at risk for osteoporosis and fractures, ask your health care provider if you should be screened.  Ask your health care provider whether you should take a calcium or vitamin D supplement to lower your risk for osteoporosis.  Menopause may have certain physical symptoms and risks.  Hormone replacement therapy may reduce some of these symptoms and risks. Talk to your health care provider about whether hormone replacement therapy is right for you. Follow these instructions at home:  Schedule regular health, dental, and eye exams.  Stay current with your immunizations.  Do not use  any tobacco products including cigarettes, chewing tobacco, or electronic cigarettes.  If you are pregnant, do not drink alcohol.  If you are breastfeeding, limit how much and how often you drink alcohol.  Limit alcohol intake to no more than 1 drink per day for nonpregnant women. One drink equals 12 ounces of beer, 5 ounces of wine, or 1 ounces of hard liquor.  Do not use street drugs.  Do not share needles.  Ask your health care provider for help if you need support or information about quitting drugs.  Tell your health care provider if you often feel depressed.  Tell your health care provider if you have ever been abused or do not feel safe at home. This information is not intended to replace advice given to you by your health care provider. Make sure you discuss any questions you have with your health care provider. Document Released: 03/29/2011 Document Revised: 02/19/2016 Document Reviewed: 06/17/2015 [employer]vier Interactive Patient Education  2019 [employer]  

## 2019-02-24 NOTE — Progress Notes (Signed)
Subjective:    Patient ID: Caroline Wall, female    DOB: 08/02/1961, 58 y.o.   MRN: [id number]  HPI She is here for a physical exam.   She still has some back pain.  It is thought it was more GI related - constipation.  She did miralax and colace daily for two weeks.  She is now doing it twice a week.  She still has some constipation.  The back pain is still there.    She burned her foot - she dropped hot beans on her foot while at the beach.  The blister did pop - she has been putting on neosporin on it.  There is no discharge and it does not hurt.      She still has a rash on her legs - she sees derm in July.  It is not getting worse and does not itch.    Stiffness in am and can't make a fist.  Fingers occasionally lock up.   She has tried blue emu.  She wondered if it was arthritis and what she can do for it.      Medications and allergies reviewed with patient and updated if appropriate.  Patient Active Problem List   Diagnosis Date Noted  . Family history of diabetes mellitus in grandmother 02/26/2019  . Fatigue 12/05/2018  . Tinea versicolor 12/05/2018  . Left-sided low back pain without sciatica 12/05/2018  . Hypothyroidism 02/16/2018  . 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 Hyperlipidemia 03/13/2016  . Palpitations 02/16/2016  . Basal cell carcinoma 12/31/2014  . Personal history of malignant melanoma 09/03/2013  . Hypertension     Current Outpatient Medications on File Prior to Visit  Medication Sig Dispense Refill  . Ascorbic Acid (VITAMIN C) 1000 MG tablet Take 1,000 mg by mouth daily.    . Multiple Vitamin (MULTIVITAMIN) tablet Take 1 tablet by mouth daily.    . Omega-3 Fatty Acids (FISH OIL) 1000 MG CAPS Take by mouth daily.     No current facility-administered medications on file prior to visit.     Past Medical History:  Diagnosis Date  . BCC (basal cell carcinoma), leg 11/2014   following at [facility] derm for same  . Depression    prior tx failure  with sertraline and wellbutrin  . Depression   . Elevated TSH 02/15/2017  . GERD (gastroesophageal reflux disease)    in past  . Heart murmur   . Hypertension   . Melanoma ([place]) 1996   L low back - follows annually for body scan at [facility]    Past Surgical History:  Procedure Laterality Date  . ABDOMINAL HYSTERECTOMY  2007  . FOOT SURGERY     left  . SKIN CANCER EXCISION     melanoma 1996, [place] 11/2014  . TONSILLECTOMY      Social History   Socioeconomic History  . Marital status: Married    Spouse name: Not on file  . Number of children: 2  . Years of education: Not on file  . Highest education level: Not on file  Occupational History  . Occupation: [employer]    Employer: [employer]  Social Needs  . Financial resource strain: Not on file  . Food insecurity:    Worry: Not on file    Inability: Not on file  . Transportation needs:    Medical: Not on file    Non-medical: Not on file  Tobacco Use  . Smoking  status: Former Smoker    Types: Cigarettes    Last attempt to quit: 09/27/1989    Years since quitting: 29.4  . Smokeless tobacco: Never Used  Substance and Sexual Activity  . Alcohol use: Yes    Comment: diet tonic and gin - decreased intake  . Drug use: No  . Sexual activity: Not on file  Lifestyle  . Physical activity:    Days per week: Not on file    Minutes per session: Not on file  . Stress: Not on file  Relationships  . Social connections:    Talks on phone: Not on file    Gets together: Not on file    Attends religious service: Not on file    Active member of club or organization: Not on file    Attends meetings of clubs or organizations: Not on file    Relationship status: Not on file  Other Topics Concern  . 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 Endometrial cancer Mother 51  . Hypertension Father   . Clotting disorder Father   . Atrial fibrillation Father   . Diabetes Paternal Grandmother   . Heart disease Paternal Grandmother 78  . Colon cancer Neg Hx   . Esophageal cancer Neg Hx   . Rectal cancer Neg Hx   . Stomach cancer Neg Hx     Review of Systems  Constitutional: Negative for chills and fever.  Eyes: Negative for visual disturbance.  Respiratory: Negative for cough, shortness of breath and wheezing.   Cardiovascular: Negative for chest pain, palpitations and leg swelling.  Gastrointestinal: Positive for constipation. Negative for abdominal pain, blood in stool, diarrhea and nausea.       No gerd  Genitourinary: Negative for dysuria and hematuria.  Musculoskeletal: Positive for arthralgias (hand stiffness) and back pain.  Skin: Positive for rash.  Neurological: Negative for light-headedness and headaches.  Psychiatric/Behavioral: Negative for dysphoric mood. The patient is not nervous/anxious.        Objective:   Vitals:   02/26/19 1403  BP: (!) 154/84  Pulse: (!) 55  Resp: 16  Temp: 98.1 F (36.7 C)  SpO2: 98%   Filed Weights   02/26/19 1403  Weight: 212 lb (96.2 kg)   Body mass index is 31.31 kg/m.  BP Readings from Last 3 Encounters:  02/26/19 (!) 154/84  01/03/19 112/72  12/05/18 126/80    Wt Readings from Last 3 Encounters:  02/26/19 212 lb (96.2 kg)  01/03/19 208 lb (94.3 kg)  12/05/18 204 lb 12.8 oz (92.9 kg)     Physical Exam Constitutional: She appears well-developed and well-nourished. No distress.  HENT:  Head: Normocephalic and atraumatic.  Right Ear: External ear normal. Normal ear canal and TM Left Ear: External ear normal.  Normal ear canal and TM Mouth/Throat: Oropharynx is clear and moist.  Eyes: Conjunctivae and EOM are normal.  Neck: Neck supple. No tracheal  deviation present. No thyromegaly present. No carotid bruit  Cardiovascular: Normal rate, regular rhythm and normal heart sounds.   No murmur heard.  No edema. Pulmonary/Chest: Effort normal and breath sounds normal. No respiratory distress. She has no wheezes. She has no rales.  Breast: deferred  Abdominal: Soft. She exhibits no distension. There is no tenderness.  Lymphadenopathy: She has no cervical adenopathy.  Skin: Skin is warm and dry. She is not diaphoretic.  Psychiatric: She has a normal mood and affect. Her behavior is normal.        Assessment & Plan:   Physical exam: Screening blood work ordered Immunizations discussed shingrix, others up to date Colonoscopy    Up to date  Mammogram   Up to date - schedule for 6/2 Gyn    Up to date - Dr Benjie Karvonen Eye exams  Up to date  Exercise  Walking at least an hour a day Weight   Has gained weight Skin  - rash - sees derm annually Substance abuse   none  See Problem List for Assessment and Plan of chronic medical problems.   FU annually, sooner if needed

## 2019-02-26 ENCOUNTER — Other Ambulatory Visit: Payer: Self-pay

## 2019-02-26 ENCOUNTER — Other Ambulatory Visit (INDEPENDENT_AMBULATORY_CARE_PROVIDER_SITE_OTHER): Payer: Managed Care, Other (non HMO)

## 2019-02-26 ENCOUNTER — Encounter: Payer: Self-pay | Admitting: Internal Medicine

## 2019-02-26 ENCOUNTER — Ambulatory Visit (INDEPENDENT_AMBULATORY_CARE_PROVIDER_SITE_OTHER): Payer: Managed Care, Other (non HMO) | Admitting: Internal Medicine

## 2019-02-26 VITALS — BP 154/84 | HR 55 | Temp 98.1°F | Resp 16 | Ht 69.0 in | Wt 212.0 lb

## 2019-02-26 DIAGNOSIS — E782 Mixed hyperlipidemia: Secondary | ICD-10-CM | POA: Diagnosis not present

## 2019-02-26 DIAGNOSIS — E038 Other specified hypothyroidism: Secondary | ICD-10-CM

## 2019-02-26 DIAGNOSIS — R21 Rash and other nonspecific skin eruption: Secondary | ICD-10-CM | POA: Insufficient documentation

## 2019-02-26 DIAGNOSIS — I10 Essential (primary) hypertension: Secondary | ICD-10-CM | POA: Diagnosis not present

## 2019-02-26 DIAGNOSIS — K5900 Constipation, unspecified: Secondary | ICD-10-CM | POA: Diagnosis not present

## 2019-02-26 DIAGNOSIS — Z833 Family history of diabetes mellitus: Secondary | ICD-10-CM | POA: Diagnosis not present

## 2019-02-26 DIAGNOSIS — M19049 Primary osteoarthritis, unspecified hand: Secondary | ICD-10-CM | POA: Diagnosis not present

## 2019-02-26 DIAGNOSIS — T300 Burn of unspecified body region, unspecified degree: Secondary | ICD-10-CM | POA: Insufficient documentation

## 2019-02-26 DIAGNOSIS — E063 Autoimmune thyroiditis: Secondary | ICD-10-CM

## 2019-02-26 DIAGNOSIS — Z0000 Encounter for general adult medical examination without abnormal findings: Secondary | ICD-10-CM

## 2019-02-26 DIAGNOSIS — Z0001 Encounter for general adult medical examination with abnormal findings: Secondary | ICD-10-CM | POA: Diagnosis not present

## 2019-02-26 DIAGNOSIS — Z85820 Personal history of malignant melanoma of skin: Secondary | ICD-10-CM

## 2019-02-26 LAB — COMPREHENSIVE METABOLIC PANEL
ALT: 32 U/L (ref 0–35)
AST: 24 U/L (ref 0–37)
Albumin: 4.4 g/dL (ref 3.5–5.2)
Alkaline Phosphatase: 47 U/L (ref 39–117)
BUN: 16 mg/dL (ref 6–23)
CO2: 27 mEq/L (ref 19–32)
Calcium: 9.5 mg/dL (ref 8.4–10.5)
Chloride: 102 mEq/L (ref 96–112)
Creatinine, Ser: 0.79 mg/dL (ref 0.40–1.20)
GFR: 75.08 mL/min (ref >60.00)
Glucose, Bld: 97 mg/dL (ref 70–99)
Potassium: 3.7 mEq/L (ref 3.5–5.1)
Sodium: 139 mEq/L (ref 135–145)
Total Bilirubin: 0.5 mg/dL (ref 0.2–1.2)
Total Protein: 7 g/dL (ref 6.0–8.3)

## 2019-02-26 LAB — TSH: TSH: 3.86 u[IU]/mL (ref 0.35–4.50)

## 2019-02-26 LAB — LIPID PANEL
Cholesterol: 240 mg/dL — ABNORMAL HIGH (ref 0–200)
HDL: 60.8 mg/dL (ref >39.00)
NonHDL: 179.68
Total CHOL/HDL Ratio: 4
Triglycerides: 269 mg/dL — ABNORMAL HIGH (ref 0.0–149.0)
VLDL: 53.8 mg/dL — ABNORMAL HIGH (ref 0.0–40.0)

## 2019-02-26 LAB — CBC WITH DIFFERENTIAL/PLATELET
Basophils Absolute: 0 10*3/uL (ref 0.0–0.1)
Basophils Relative: 0.8 % (ref 0.0–3.0)
Eosinophils Absolute: 0.2 10*3/uL (ref 0.0–0.7)
Eosinophils Relative: 3.8 % (ref 0.0–5.0)
HCT: 40.5 % (ref 36.0–46.0)
Hemoglobin: 14.4 g/dL (ref 12.0–15.0)
Lymphocytes Relative: 29.1 % (ref 12.0–46.0)
Lymphs Abs: 1.6 10*3/uL (ref 0.7–4.0)
MCHC: 35.6 g/dL (ref 30.0–36.0)
MCV: 90.5 fl (ref 78.0–100.0)
Monocytes Absolute: 0.6 10*3/uL (ref 0.1–1.0)
Monocytes Relative: 10.9 % (ref 3.0–12.0)
Neutro Abs: 3 10*3/uL (ref 1.4–7.7)
Neutrophils Relative %: 55.4 % (ref 43.0–77.0)
Platelets: 205 10*3/uL (ref 150.0–400.0)
RBC: 4.48 Mil/uL (ref 3.87–5.11)
RDW: 13.3 % (ref 11.5–15.5)
WBC: 5.4 10*3/uL (ref 4.0–10.5)

## 2019-02-26 LAB — LDL CHOLESTEROL, DIRECT: Direct LDL: 145 mg/dL

## 2019-02-26 LAB — HEMOGLOBIN A1C: Hgb A1c MFr Bld: 5.2 % (ref 4.6–6.5)

## 2019-02-26 MED ORDER — HYDROCHLOROTHIAZIDE 25 MG PO TABS: 25.0000 mg | ORAL_TABLET | Freq: Every day | ORAL | 3 refills | Status: DC

## 2019-02-26 MED ORDER — TURMERIC 450 MG PO CAPS: 500.0000 | ORAL_CAPSULE | Freq: Every day | ORAL | Status: DC

## 2019-02-26 MED ORDER — LEVOTHYROXINE SODIUM 25 MCG PO TABS: 50.0000 ug | ORAL_TABLET | Freq: Every day | ORAL | 0 refills | Status: DC

## 2019-02-26 NOTE — Assessment & Plan Note (Signed)
a1c

## 2019-02-26 NOTE — Assessment & Plan Note (Signed)
Taking tumeric Can add tart cherry juice caps Topical arthritis medication

## 2019-02-26 NOTE — Assessment & Plan Note (Signed)
Left foot Blister popped No pain No signs of infection  Continue to apply neosporin and keep covered

## 2019-02-26 NOTE — Assessment & Plan Note (Signed)
Sees derm annually 

## 2019-02-26 NOTE — Assessment & Plan Note (Addendum)
BP elevated here today but typically well controlled Continue current medication She will monitor BP at home  cmp

## 2019-02-26 NOTE — Assessment & Plan Note (Signed)
Clinically euthyroid Check tsh  Titrate med dose if needed  

## 2019-02-26 NOTE — Assessment & Plan Note (Signed)
Discussed several things she can take for her constipation Probiotics, kefir, colace, miralax, different foods, metamucil Continue regular exercise Continue increased fluids Call if no improvement

## 2019-02-26 NOTE — Assessment & Plan Note (Signed)
Diet controlled Lipid, cmp Continue exercise Work on diet

## 2019-02-26 NOTE — Assessment & Plan Note (Signed)
B/l upper legs - no itching and it is not spreading ? Fungal vs eczema  Sees derm in July and will ask her

## 2019-02-27 ENCOUNTER — Ambulatory Visit
Admission: RE | Admit: 2019-02-27 | Discharge: 2019-02-27 | Disposition: A | Discharge status: Home / Self Care | Payer: Managed Care, Other (non HMO) | Source: Ambulatory Visit | Attending: Internal Medicine | Admitting: Internal Medicine

## 2019-02-27 ENCOUNTER — Encounter: Payer: Self-pay | Admitting: Internal Medicine

## 2019-02-27 ENCOUNTER — Other Ambulatory Visit: Payer: Self-pay

## 2019-02-27 DIAGNOSIS — Z1231 Encounter for screening mammogram for malignant neoplasm of breast: Secondary | ICD-10-CM

## 2019-02-28 ENCOUNTER — Encounter: Payer: Self-pay | Admitting: Internal Medicine

## 2019-02-28 DIAGNOSIS — E039 Hypothyroidism, unspecified: Secondary | ICD-10-CM

## 2019-03-01 MED ORDER — LEVOTHYROXINE SODIUM 50 MCG PO TABS: ORAL_TABLET | ORAL | 3 refills | Status: DC

## 2019-03-13 ENCOUNTER — Other Ambulatory Visit: Payer: Self-pay

## 2019-03-13 MED ORDER — LEVOTHYROXINE SODIUM 50 MCG PO TABS: ORAL_TABLET | ORAL | 3 refills | Status: DC

## 2019-03-14 ENCOUNTER — Encounter: Payer: Self-pay | Admitting: Internal Medicine

## 2019-05-07 ENCOUNTER — Encounter: Payer: Self-pay | Admitting: Podiatry

## 2019-05-07 ENCOUNTER — Ambulatory Visit: Payer: Managed Care, Other (non HMO) | Admitting: Podiatry

## 2019-05-07 ENCOUNTER — Ambulatory Visit (INDEPENDENT_AMBULATORY_CARE_PROVIDER_SITE_OTHER): Payer: Managed Care, Other (non HMO)

## 2019-05-07 ENCOUNTER — Other Ambulatory Visit: Payer: Self-pay

## 2019-05-07 ENCOUNTER — Ambulatory Visit (INDEPENDENT_AMBULATORY_CARE_PROVIDER_SITE_OTHER): Payer: Managed Care, Other (non HMO) | Admitting: Podiatry

## 2019-05-07 VITALS — BP 140/73 | HR 52 | Temp 97.8°F | Resp 16

## 2019-05-07 DIAGNOSIS — M722 Plantar fascial fibromatosis: Secondary | ICD-10-CM

## 2019-05-07 MED ORDER — DICLOFENAC SODIUM 75 MG PO TBEC: 75.0000 mg | DELAYED_RELEASE_TABLET | Freq: Two times a day (BID) | ORAL | 2 refills | Status: DC

## 2019-05-07 NOTE — Patient Instructions (Signed)

## 2019-05-08 NOTE — Progress Notes (Signed)
Subjective:   Patient ID: Caroline Wall, female   DOB: 58 y.o.   MRN: [id number]   HPI Patient states having a lot of pain in the bottom of the right heel and thinks she had surgery for the left heel approximately 8 years ago.  Patient likes to be active and hike but states it is been really bothering her recently and patient does not smoke and would like to be more active   Review of Systems  All other systems reviewed and are negative.       Objective:  Physical Exam Vitals signs and nursing note reviewed.  Constitutional:      Appearance: She is well-developed.  Pulmonary:     Effort: Pulmonary effort is normal.  Musculoskeletal: Normal range of motion.  Skin:    General: Skin is warm.  Neurological:     Mental Status: She is alert.     Neurovascular status intact muscle strength was found to be adequate range of motion within normal limits with patient found to have exquisite discomfort plantar aspect heel right at the insertional point tendon calcaneus with inflammation fluid buildup and is noted to have moderate discomfort left but nowhere near to the same degree.  Patient has good digital perfusion well oriented x3     Assessment:  Acute plantar fasciitis right over left with inflammation pain     Plan:  H&P conditions reviewed and today I went ahead did sterile prep and injected the fascia right 3 mg Kenalog 5 mg Xylocaine applied fascial brace gave instructions for physical therapy shoe gear modifications placed on diclofenac 75 mg twice daily and reappoint to recheck  X-ray indicates there is spur no indication to stress fracture arthritis signed visit

## 2019-05-09 ENCOUNTER — Encounter: Payer: Self-pay | Admitting: Internal Medicine

## 2019-05-16 ENCOUNTER — Other Ambulatory Visit (INDEPENDENT_AMBULATORY_CARE_PROVIDER_SITE_OTHER): Payer: Managed Care, Other (non HMO)

## 2019-05-16 ENCOUNTER — Ambulatory Visit (INDEPENDENT_AMBULATORY_CARE_PROVIDER_SITE_OTHER): Payer: Managed Care, Other (non HMO)

## 2019-05-16 DIAGNOSIS — Z23 Encounter for immunization: Secondary | ICD-10-CM

## 2019-05-16 DIAGNOSIS — E039 Hypothyroidism, unspecified: Secondary | ICD-10-CM

## 2019-05-16 DIAGNOSIS — Z299 Encounter for prophylactic measures, unspecified: Secondary | ICD-10-CM

## 2019-05-16 LAB — TSH: TSH: 3.54 u[IU]/mL (ref 0.35–4.50)

## 2019-05-17 ENCOUNTER — Encounter: Payer: Self-pay | Admitting: Internal Medicine

## 2019-05-17 ENCOUNTER — Other Ambulatory Visit: Payer: Self-pay | Admitting: Internal Medicine

## 2019-05-17 DIAGNOSIS — E039 Hypothyroidism, unspecified: Secondary | ICD-10-CM

## 2019-05-17 MED ORDER — LEVOTHYROXINE SODIUM 75 MCG PO TABS: 75.0000 ug | ORAL_TABLET | Freq: Every day | ORAL | 1 refills | Status: DC

## 2019-05-21 ENCOUNTER — Other Ambulatory Visit: Payer: Self-pay

## 2019-05-21 ENCOUNTER — Encounter: Payer: Self-pay | Admitting: Podiatry

## 2019-05-21 ENCOUNTER — Ambulatory Visit (INDEPENDENT_AMBULATORY_CARE_PROVIDER_SITE_OTHER): Payer: Managed Care, Other (non HMO) | Admitting: Podiatry

## 2019-05-21 VITALS — Temp 97.7°F

## 2019-05-21 DIAGNOSIS — M722 Plantar fascial fibromatosis: Secondary | ICD-10-CM | POA: Diagnosis not present

## 2019-05-21 NOTE — Progress Notes (Signed)
Subjective:   Patient ID: Caroline Wall, female   DOB: 58 y.o.   MRN: [id number]   HPI Patient states my heel is feeling quite a bit better but I had significant family history and ended up with surgery on my other 1 so I am still concerned and I like to be very active   ROS      Objective:  Physical Exam  Neurovascular status intact with patient noted to have significant reduction of discomfort in the right heel at insertion with mild swelling still noted and pain upon deep deep palpation.  She appears to be walking with a better heel toe gait pattern     Assessment:  Improving from fasciitis-like symptoms right but patient does have long-term history of fasciitis with history of surgery     Plan:  H&P spent a great deal of time reviewing condition treatment options and at this point continue conservative care with shoe gear modifications ice stretch and I did casted for functional orthotics today.  Patient will be seen back when orthotics return and is encouraged to call with any concerns prior to that event with instructions given on the brace usage for that.

## 2019-05-28 ENCOUNTER — Encounter: Payer: Self-pay | Admitting: Internal Medicine

## 2019-06-12 ENCOUNTER — Other Ambulatory Visit: Payer: Self-pay

## 2019-06-12 ENCOUNTER — Ambulatory Visit: Payer: Managed Care, Other (non HMO) | Admitting: Orthotics

## 2019-06-12 DIAGNOSIS — M722 Plantar fascial fibromatosis: Secondary | ICD-10-CM

## 2019-06-12 DIAGNOSIS — Z833 Family history of diabetes mellitus: Secondary | ICD-10-CM

## 2019-06-12 NOTE — Progress Notes (Signed)
Patient came in today to pick up custom made foot orthotics.  The goals were accomplished and the patient reported no dissatisfaction with said orthotics.  Patient was advised of breakin period and how to report any issues. 

## 2019-06-25 ENCOUNTER — Other Ambulatory Visit (INDEPENDENT_AMBULATORY_CARE_PROVIDER_SITE_OTHER): Payer: Managed Care, Other (non HMO)

## 2019-06-25 DIAGNOSIS — E039 Hypothyroidism, unspecified: Secondary | ICD-10-CM | POA: Diagnosis not present

## 2019-06-25 LAB — TSH: TSH: 1.46 u[IU]/mL (ref 0.35–4.50)

## 2019-06-26 ENCOUNTER — Encounter: Payer: Self-pay | Admitting: Internal Medicine

## 2019-07-11 ENCOUNTER — Other Ambulatory Visit: Payer: Self-pay

## 2019-07-11 DIAGNOSIS — Z20822 Contact with and (suspected) exposure to covid-19: Secondary | ICD-10-CM

## 2019-07-12 LAB — NOVEL CORONAVIRUS, NAA: SARS-CoV-2, NAA: NOT DETECTED

## 2019-07-16 ENCOUNTER — Other Ambulatory Visit: Payer: Self-pay

## 2019-07-16 MED ORDER — LEVOTHYROXINE SODIUM 75 MCG PO TABS: 75.0000 ug | ORAL_TABLET | Freq: Every day | ORAL | 1 refills | Status: DC

## 2019-07-25 ENCOUNTER — Encounter: Payer: Self-pay | Admitting: Internal Medicine

## 2019-08-13 ENCOUNTER — Other Ambulatory Visit: Payer: Self-pay

## 2019-08-13 ENCOUNTER — Ambulatory Visit (INDEPENDENT_AMBULATORY_CARE_PROVIDER_SITE_OTHER): Payer: Managed Care, Other (non HMO)

## 2019-08-13 DIAGNOSIS — Z299 Encounter for prophylactic measures, unspecified: Secondary | ICD-10-CM

## 2019-08-13 DIAGNOSIS — Z23 Encounter for immunization: Secondary | ICD-10-CM | POA: Diagnosis not present

## 2019-09-19 ENCOUNTER — Encounter: Payer: Self-pay | Admitting: Internal Medicine

## 2019-09-20 ENCOUNTER — Ambulatory Visit: Payer: Managed Care, Other (non HMO) | Attending: Internal Medicine

## 2019-09-20 ENCOUNTER — Other Ambulatory Visit: Payer: Self-pay

## 2019-09-20 DIAGNOSIS — Z20822 Contact with and (suspected) exposure to covid-19: Secondary | ICD-10-CM

## 2019-09-22 LAB — NOVEL CORONAVIRUS, NAA: SARS-CoV-2, NAA: NOT DETECTED

## 2019-10-05 ENCOUNTER — Encounter: Payer: Self-pay | Admitting: Internal Medicine

## 2019-10-08 ENCOUNTER — Other Ambulatory Visit: Payer: Self-pay

## 2019-10-08 MED ORDER — LEVOTHYROXINE SODIUM 75 MCG PO TABS: 75.0000 ug | ORAL_TABLET | Freq: Every day | ORAL | 1 refills | Status: DC

## 2019-10-08 MED ORDER — HYDROCHLOROTHIAZIDE 25 MG PO TABS: 25.0000 mg | ORAL_TABLET | Freq: Every day | ORAL | 1 refills | Status: DC

## 2019-11-28 ENCOUNTER — Ambulatory Visit: Payer: Managed Care, Other (non HMO) | Admitting: Internal Medicine

## 2019-11-29 ENCOUNTER — Other Ambulatory Visit: Payer: Self-pay

## 2019-11-29 ENCOUNTER — Encounter: Payer: Self-pay | Admitting: Podiatry

## 2019-11-29 ENCOUNTER — Telehealth: Payer: Self-pay | Admitting: Podiatry

## 2019-11-29 ENCOUNTER — Ambulatory Visit (INDEPENDENT_AMBULATORY_CARE_PROVIDER_SITE_OTHER): Payer: 59 | Admitting: Podiatry

## 2019-11-29 VITALS — Temp 98.0°F

## 2019-11-29 DIAGNOSIS — M722 Plantar fascial fibromatosis: Secondary | ICD-10-CM

## 2019-11-29 NOTE — Telephone Encounter (Signed)
I just got home from seeing Dr. Paulla Dolly. We scheduled sx for my plantar fasciitis on 01/15/20. I'm scheduled to go on vacation in May which will involve hiking and was wanting my move my sx up a week to 04/13. I told the pt I would get her sx moved up a week and that she could go ahead and register online with the sx via [employer].

## 2019-11-29 NOTE — Progress Notes (Signed)
Subjective:   Patient ID: Caroline Wall, female   DOB: 59 y.o.   MRN: [id number]   HPI Patient states her heel is really killing her and she had her other 1 fixed and she wants this 1 fixed like that 1 and states that she has been struggling with it for the last 6 months and before that when I had treated nerve   ROS      Objective:  Physical Exam  Vascular status intact with patient found to have exquisite discomfort plantar aspect right heel at the insertional point tendon calcaneus with inflammation fluid of the medial band at its insertion into the calcaneus     Assessment:  Neck plantar fasciitis right with inflammation fluid buildup     Plan:  H&P reviewed condition and at this point due to longstanding nature failure to respond on the other heel with ultimate surgery I recommended endoscopic release.  This is what patient wants and I allowed her to read consent form going over alternative treatments complications and she understands no guarantee as far as success of surgery and is willing to accept risk and at this point after extensive review of alternative treatments and complications she signed consent form.  I dispensed air fracture walker with all instructions on usage and she will get used to it prior to procedure and will try to wear it short-term to take some of the pain out of her heel and she is scheduled for approximately 4 to 6 weeks and is encouraged to call with questions concerns which may arise prior to procedure

## 2019-11-29 NOTE — Patient Instructions (Signed)
Pre-Operative Instructions  Congratulations, you have decided to take an important step towards improving your quality of life.  You can be assured that the doctors and staff at [facility] will be with you every step of the way.  Here are some important things you should know:  1. Plan to be at the surgery center/hospital at least 1 (one) hour prior to your scheduled time, unless otherwise directed by the surgical center/hospital staff.  You must have a responsible adult accompany you, remain during the surgery and drive you home.  Make sure you have directions to the surgical center/hospital to ensure you arrive on time. 2. If you are having surgery at [facility] or [facility], you will need a copy of your medical history and physical form from your family physician within one month prior to the date of surgery. We will give you a form for your primary physician to complete.  3. We make every effort to accommodate the date you request for surgery.  However, there are times where surgery dates or times have to be moved.  We will contact you as soon as possible if a change in schedule is required.   4. No aspirin/ibuprofen for one week before surgery.  If you are on aspirin, any non-steroidal anti-inflammatory medications (Mobic, Aleve, Ibuprofen) should not be taken seven (7) days prior to your surgery.  You make take Tylenol for pain prior to surgery.  5. Medications - If you are taking daily heart and blood pressure medications, seizure, reflux, allergy, asthma, anxiety, pain or diabetes medications, make sure you notify the surgery center/hospital before the day of surgery so they can tell you which medications you should take or avoid the day of surgery. 6. No food or drink after midnight the night before surgery unless directed otherwise by surgical center/hospital staff. 7. No alcoholic beverages 24-hours prior to surgery.  No smoking 24-hours prior or 24-hours after  surgery. 8. Wear loose pants or shorts. They should be loose enough to fit over bandages, boots, and casts. 9. Don't wear slip-on shoes. Sneakers are preferred. 10. Bring your boot with you to the surgery center/hospital.  Also bring crutches or a walker if your physician has prescribed it for you.  If you do not have this equipment, it will be provided for you after surgery. 11. If you have not been contacted by the surgery center/hospital by the day before your surgery, call to confirm the date and time of your surgery. 12. Leave-time from work may vary depending on the type of surgery you have.  Appropriate arrangements should be made prior to surgery with your employer. 13. Prescriptions will be provided immediately following surgery by your doctor.  Fill these as soon as possible after surgery and take the medication as directed. Pain medications will not be refilled on weekends and must be approved by the doctor. 14. Remove nail polish on the operative foot and avoid getting pedicures prior to surgery. 15. Wash the night before surgery.  The night before surgery wash the foot and leg well with water and the antibacterial soap provided. Be sure to pay special attention to beneath the toenails and in between the toes.  Wash for at least three (3) minutes. Rinse thoroughly with water and dry well with a towel.  Perform this wash unless told not to do so by your physician.  Enclosed: 1 Ice pack (please put in freezer the night before surgery)   1 Hibiclens skin cleaner     Pre-op instructions  If you have any questions regarding the instructions, please do not hesitate to call our office.  [place]: [address], , [place] [address] -- [phone]  [place]: [address] [place], [place] [address] -- [phone]  [place]: [address], [place],  [address] -- [phone]   Website: [email or web address] 

## 2019-12-17 ENCOUNTER — Telehealth: Payer: Self-pay | Admitting: Podiatry

## 2019-12-17 NOTE — Telephone Encounter (Signed)
DOS: 01/08/2020  SURGICAL PROCEDURE: Endoscopic Plantar Fasciotomy Med Band [phone]).  [employer] Effective 09/28/2019 - 09/26/2020  Deductible: $3,000 with $356.82 met and $9,906.89 remains. Out of Pocket: $6,550 with $356.82 met and $6,193.18 remains. CoInsurance: 80% / 20%  NOTIFICATION/PRIOR AUTHORIZATION NUMBER [id number]   CASE STATUS  Closed  CASE STATUS REASON Case Was Managed And Is Now Complete  PRIMARY CARE PHYSICIAN - ADVANCE NOTIFY DATE/TIME 12/12/2019 10:34 AM CDT ADMISSION NOTIFY DATE/TIME - COVERAGE STATUS OVERALL COVERAGE STATUS Covered/Approved 1-1 CODE DESCRIPTION COVERAGE STATUS DECISION DATE [facility] Coverage determination is reflected for the facility admission and is not a guarantee of payment for ongoing services. Covered/Approved 12/12/2019 1 [id number] Endoscopic plantar fasciotomy Covered/Approved 12/12/2019

## 2019-12-25 ENCOUNTER — Encounter: Payer: Self-pay | Admitting: Internal Medicine

## 2020-01-07 MED ORDER — HYDROCODONE-ACETAMINOPHEN 10-325 MG PO TABS: 1.0000 | ORAL_TABLET | Freq: Three times a day (TID) | ORAL | 0 refills | Status: AC | PRN

## 2020-01-07 NOTE — Addendum Note (Signed)
Addended by: Wallene Huh on: 01/07/2020 03:25 PM   Modules accepted: Orders

## 2020-01-08 ENCOUNTER — Encounter: Payer: Self-pay | Admitting: Podiatry

## 2020-01-08 ENCOUNTER — Telehealth: Payer: Self-pay | Admitting: *Deleted

## 2020-01-08 ENCOUNTER — Other Ambulatory Visit: Payer: Self-pay | Admitting: Podiatry

## 2020-01-08 DIAGNOSIS — M722 Plantar fascial fibromatosis: Secondary | ICD-10-CM

## 2020-01-08 MED ORDER — MEPERIDINE HCL 50 MG PO TABS: 50.0000 mg | ORAL_TABLET | Freq: Four times a day (QID) | ORAL | 0 refills | Status: DC | PRN

## 2020-01-08 MED ORDER — HYDROMORPHONE HCL 2 MG PO TABS: 2.0000 mg | ORAL_TABLET | Freq: Four times a day (QID) | ORAL | 0 refills | Status: DC | PRN

## 2020-01-08 NOTE — Telephone Encounter (Signed)
Pt called states Caroline Wall does not have the demerol. I asked pt if she was able to take synthetic codiene and she stated she had never taken and had rash and hives to the Codeine. Dr. Jacqualyn Posey ordered dilaudid 2 mg every 6 hours but if tolerates well can take every 4 hours. I informed pt of Dr. Leigh Aurora orders.

## 2020-01-08 NOTE — Progress Notes (Signed)
Resent pain medication as it was sent to [facility]. Also she was worried about having an allergy to codeine. Sent demerol to the pharmacy. Shelly informed the patient.

## 2020-01-08 NOTE — Progress Notes (Signed)
Sent Dilaudid 2mg  q6h prn pain. She did not get the demerol as the pharmacy did not have it. She is allergic to codeine but never tried the synthetic codeine.

## 2020-01-08 NOTE — Telephone Encounter (Signed)
Left message requesting a call back to discuss her foot and the medication.

## 2020-01-08 NOTE — Telephone Encounter (Signed)
[facility] states norco 10/325mg  #15 was ordered but pt has an allergy to Codiene and was this meant to be sent to a local pharmacy, Reference: [id number].

## 2020-01-09 ENCOUNTER — Telehealth: Payer: Self-pay

## 2020-01-09 NOTE — Telephone Encounter (Signed)
Called pt to follow up on how she was feeling post op. Pt stated that she feels great no pain. She was been icing her foot and keeping it elevated. Pt also stated that she has only had to take pain meds once. I asked pt to state he pain score and she stated that it was a 1 out of 10. Bandage is clean, dry and intact. I make sure she was aware that her Post op visit was schedule for Thursday 01/17/20. She wanted to see if she could possibly change it. I transferred the pt over to [facility] to see if she could get it changed.

## 2020-01-14 ENCOUNTER — Encounter: Payer: 59 | Admitting: Podiatry

## 2020-01-16 ENCOUNTER — Encounter: Payer: Self-pay | Admitting: Podiatry

## 2020-01-16 ENCOUNTER — Other Ambulatory Visit: Payer: Self-pay

## 2020-01-16 ENCOUNTER — Ambulatory Visit (INDEPENDENT_AMBULATORY_CARE_PROVIDER_SITE_OTHER): Payer: 59 | Admitting: Podiatry

## 2020-01-16 VITALS — BP 136/73 | HR 57 | Temp 97.7°F | Resp 16

## 2020-01-16 DIAGNOSIS — M722 Plantar fascial fibromatosis: Secondary | ICD-10-CM

## 2020-01-16 NOTE — Progress Notes (Signed)
Subjective:   Patient ID: Caroline Wall, female   DOB: 59 y.o.   MRN: [id number]   HPI Patient states doing well with my foot and very pleased so far   ROS      Objective:  Physical Exam  Neurovascular status intact negative Bevelyn Buckles' sign noted wound edges well coapted medial and lateral side right heel     Assessment:  Doing well post endoscopic release right     Plan:  Sterile dressing reapplied continue elevation compression immobilization dispensed surgical shoe reappoint 2 weeks suture removal earlier if needed

## 2020-01-17 ENCOUNTER — Encounter: Payer: 59 | Admitting: Podiatry

## 2020-01-28 ENCOUNTER — Other Ambulatory Visit: Payer: Self-pay | Admitting: Internal Medicine

## 2020-01-28 DIAGNOSIS — Z1231 Encounter for screening mammogram for malignant neoplasm of breast: Secondary | ICD-10-CM

## 2020-01-30 ENCOUNTER — Encounter: Payer: Self-pay | Admitting: Podiatry

## 2020-01-30 ENCOUNTER — Ambulatory Visit (INDEPENDENT_AMBULATORY_CARE_PROVIDER_SITE_OTHER): Payer: 59 | Admitting: Podiatry

## 2020-01-30 ENCOUNTER — Other Ambulatory Visit: Payer: Self-pay

## 2020-01-30 DIAGNOSIS — M722 Plantar fascial fibromatosis: Secondary | ICD-10-CM

## 2020-01-30 NOTE — Progress Notes (Signed)
Subjective:   Patient ID: Caroline Wall, female   DOB: 59 y.o.   MRN: [id number]   HPI She states she is doing really well with surgery very pleased with minimal plantar discomfort   ROS      Objective:  Physical Exam  Neurovascular status negative Bevelyn Buckles' sign noted with patient found to have incisions well-healed right and left medial and lateral heel     Assessment:  Doing well post endoscopic surgery right     Plan:  Stitches removed wound edges coapted well dispensed ankle compression stocking and continue with immobilization as needed gradual return to soft shoe and patient will be seen back as needed

## 2020-02-04 ENCOUNTER — Encounter: Payer: 59 | Admitting: Podiatry

## 2020-02-29 ENCOUNTER — Other Ambulatory Visit: Payer: Self-pay

## 2020-02-29 ENCOUNTER — Ambulatory Visit
Admission: RE | Admit: 2020-02-29 | Discharge: 2020-02-29 | Disposition: A | Discharge status: Home / Self Care | Payer: 59 | Source: Ambulatory Visit | Attending: Internal Medicine | Admitting: Internal Medicine

## 2020-02-29 DIAGNOSIS — Z1231 Encounter for screening mammogram for malignant neoplasm of breast: Secondary | ICD-10-CM

## 2020-04-19 ENCOUNTER — Encounter: Payer: Self-pay | Admitting: Podiatry

## 2020-04-30 ENCOUNTER — Encounter: Payer: Self-pay | Admitting: Podiatry

## 2020-05-08 ENCOUNTER — Encounter: Payer: Self-pay | Admitting: Podiatry

## 2020-05-08 ENCOUNTER — Other Ambulatory Visit: Payer: Self-pay

## 2020-05-08 ENCOUNTER — Ambulatory Visit (INDEPENDENT_AMBULATORY_CARE_PROVIDER_SITE_OTHER): Payer: 59 | Admitting: Podiatry

## 2020-05-08 DIAGNOSIS — M7671 Peroneal tendinitis, right leg: Secondary | ICD-10-CM | POA: Diagnosis not present

## 2020-05-13 DIAGNOSIS — L920 Granuloma annulare: Secondary | ICD-10-CM | POA: Insufficient documentation

## 2020-05-13 NOTE — Progress Notes (Signed)
Subjective:   Patient ID: Caroline Wall, female   DOB: 58 y.o.   MRN: [id number]   HPI Patient states that the heel for the most part is doing really well but she is having pain more in the outside of the foot and was not sure whether this was normal.   ROS      Objective:  Physical Exam  Neurovascular status intact muscle strength found to be adequate with patient found to have quite a bit of discomfort in the peroneal insertion into the base of the fifth metatarsal with fluid buildup noted and no indication of tendon dysfunction.  The plantar heel is doing well     Assessment:  Peroneal tendinitis right with inflammation     Plan:  Sterile prep done and I did go ahead and injected the insertion 3 mg Dexasone Kenalog 5 mg Xylocaine advised on ice therapy anti-inflammatories and reappoint to recheck

## 2020-05-23 ENCOUNTER — Encounter: Payer: 59 | Admitting: Internal Medicine

## 2020-06-04 ENCOUNTER — Encounter: Payer: 59 | Admitting: Internal Medicine

## 2020-06-09 ENCOUNTER — Other Ambulatory Visit: Payer: Self-pay | Admitting: Internal Medicine

## 2020-06-12 ENCOUNTER — Encounter: Payer: Self-pay | Admitting: Internal Medicine

## 2020-06-12 DIAGNOSIS — I10 Essential (primary) hypertension: Secondary | ICD-10-CM

## 2020-06-12 DIAGNOSIS — E782 Mixed hyperlipidemia: Secondary | ICD-10-CM

## 2020-06-12 DIAGNOSIS — Z833 Family history of diabetes mellitus: Secondary | ICD-10-CM

## 2020-06-12 DIAGNOSIS — E038 Other specified hypothyroidism: Secondary | ICD-10-CM

## 2020-06-17 ENCOUNTER — Other Ambulatory Visit (INDEPENDENT_AMBULATORY_CARE_PROVIDER_SITE_OTHER): Payer: 59

## 2020-06-17 DIAGNOSIS — E038 Other specified hypothyroidism: Secondary | ICD-10-CM | POA: Diagnosis not present

## 2020-06-17 DIAGNOSIS — E782 Mixed hyperlipidemia: Secondary | ICD-10-CM

## 2020-06-17 DIAGNOSIS — Z833 Family history of diabetes mellitus: Secondary | ICD-10-CM | POA: Diagnosis not present

## 2020-06-17 DIAGNOSIS — I10 Essential (primary) hypertension: Secondary | ICD-10-CM | POA: Diagnosis not present

## 2020-06-17 DIAGNOSIS — E063 Autoimmune thyroiditis: Secondary | ICD-10-CM

## 2020-06-17 LAB — COMPREHENSIVE METABOLIC PANEL
ALT: 45 U/L — ABNORMAL HIGH (ref 0–35)
AST: 32 U/L (ref 0–37)
Albumin: 4.5 g/dL (ref 3.5–5.2)
Alkaline Phosphatase: 47 U/L (ref 39–117)
BUN: 18 mg/dL (ref 6–23)
CO2: 26 mEq/L (ref 19–32)
Calcium: 9.3 mg/dL (ref 8.4–10.5)
Chloride: 101 mEq/L (ref 96–112)
Creatinine, Ser: 0.83 mg/dL (ref 0.40–1.20)
GFR: 70.6 mL/min (ref >60.00)
Glucose, Bld: 105 mg/dL — ABNORMAL HIGH (ref 70–99)
Potassium: 3.9 mEq/L (ref 3.5–5.1)
Sodium: 137 mEq/L (ref 135–145)
Total Bilirubin: 0.6 mg/dL (ref 0.2–1.2)
Total Protein: 6.9 g/dL (ref 6.0–8.3)

## 2020-06-17 LAB — CBC WITH DIFFERENTIAL/PLATELET
Basophils Absolute: 0 10*3/uL (ref 0.0–0.1)
Basophils Relative: 0.6 % (ref 0.0–3.0)
Eosinophils Absolute: 0.1 10*3/uL (ref 0.0–0.7)
Eosinophils Relative: 3.1 % (ref 0.0–5.0)
HCT: 40.8 % (ref 36.0–46.0)
Hemoglobin: 14.2 g/dL (ref 12.0–15.0)
Lymphocytes Relative: 27.8 % (ref 12.0–46.0)
Lymphs Abs: 1.3 10*3/uL (ref 0.7–4.0)
MCHC: 34.8 g/dL (ref 30.0–36.0)
MCV: 90.4 fl (ref 78.0–100.0)
Monocytes Absolute: 0.4 10*3/uL (ref 0.1–1.0)
Monocytes Relative: 9 % (ref 3.0–12.0)
Neutro Abs: 2.8 10*3/uL (ref 1.4–7.7)
Neutrophils Relative %: 59.5 % (ref 43.0–77.0)
Platelets: 187 10*3/uL (ref 150.0–400.0)
RBC: 4.52 Mil/uL (ref 3.87–5.11)
RDW: 13.1 % (ref 11.5–15.5)
WBC: 4.7 10*3/uL (ref 4.0–10.5)

## 2020-06-17 LAB — LIPID PANEL
Cholesterol: 244 mg/dL — ABNORMAL HIGH (ref 0–200)
HDL: 56.1 mg/dL (ref >39.00)
LDL Cholesterol: 162 mg/dL — ABNORMAL HIGH (ref 0–99)
NonHDL: 187.89
Total CHOL/HDL Ratio: 4
Triglycerides: 131 mg/dL (ref 0.0–149.0)
VLDL: 26.2 mg/dL (ref 0.0–40.0)

## 2020-06-17 LAB — TSH: TSH: 2.96 u[IU]/mL (ref 0.35–4.50)

## 2020-06-17 LAB — HEMOGLOBIN A1C: Hgb A1c MFr Bld: 5.3 % (ref 4.6–6.5)

## 2020-06-17 NOTE — Progress Notes (Signed)
Subjective:    Patient ID: Caroline Wall, female    DOB: [birth year], 59 y.o.   MRN: [id number]  HPI She is here for a physical exam.   She had her blood work done.  She cholesterol is high.  She BP has been elevated.    She is unmotivated when trying to lose weight.  She is hiking 2-3 miles a day.    She stays up too late ( eats or drinks wine then ).  Not sleeping enough. She knows she needs to make changes.    Medications and allergies reviewed with patient and updated if appropriate.  Patient Active Problem List   Diagnosis Date Noted  . Family history of diabetes mellitus in grandmother 02/26/2019  . 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 Rash and nonspecific skin eruption 02/26/2019  . Constipation 02/26/2019  . Fatigue 12/05/2018  . Tinea versicolor 12/05/2018  . Left-sided low back pain without sciatica 12/05/2018  . Hypothyroidism 02/16/2018  . 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 Hyperlipidemia 03/13/2016  . Palpitations 02/16/2016  . Basal cell carcinoma 12/31/2014  . Personal history of malignant melanoma 09/03/2013  . Hypertension     Current Outpatient Medications on File Prior to Visit  Medication Sig Dispense Refill  . Ascorbic Acid (VITAMIN C) 1000 MG tablet Take 1,000 mg by mouth daily.    . Cholecalciferol (VITAMIN D) 10 MCG/ML LIQD     . hydrochlorothiazide (HYDRODIURIL) 25 MG tablet Take 1 tablet (25 mg total) by mouth daily. 90 tablet 1  . levothyroxine (SYNTHROID) 75 MCG tablet Take 1 tablet (75 mcg total) by mouth daily. 90 tablet 1  . Multiple Vitamin (MULTIVITAMIN) tablet Take 1 tablet by mouth daily.    . Multiple Vitamins-Minerals (ONE DAILY CALCIUM/IRON) TABS Take by mouth.    . Omega-3 Fatty Acids (FISH OIL) 1000 MG CAPS Take by mouth daily.    . Turmeric 500 MG CAPS Take by mouth.     No current facility-administered medications on file prior to visit.    Past Medical History:  Diagnosis Date  . BCC (basal cell carcinoma), leg 11/2014    following at [facility] derm for same  . Depression    prior tx failure with sertraline and wellbutrin  . Depression   . Elevated TSH 02/15/2017  . GERD (gastroesophageal reflux disease)    in past  . Heart murmur   . Hypertension   . Melanoma ([place]) 1996   L low back - follows annually for body scan at [facility]    Past Surgical History:  Procedure Laterality Date  . ABDOMINAL HYSTERECTOMY  2007  . FOOT SURGERY     left  . SKIN CANCER EXCISION     melanoma 1996, [place] 11/2014  . TONSILLECTOMY      Social History   Socioeconomic History  . Marital status: Married    Spouse name: Not on file  . Number of children: 2  . Years of education: Not on file  . Highest education level: Not on file  Occupational History  . Occupation: [employer]    Employer: [employer]  Tobacco Use  . Smoking status: Former Smoker    Types: Cigarettes    Quit date: 09/27/1989    Years since quitting: 30.7  . Smokeless tobacco: Never Used  Substance and Sexual Activity  . Alcohol use: Yes    Comment: diet tonic and gin - decreased intake  . Drug use: No  . Sexual  activity: Not on file  Other Topics Concern  . 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 Difficulty of Paying Living Expenses: Not on file  Food Insecurity:   . Worried About Charity fundraiser in the Last Year: Not on file  . Ran Out of Food in the Last Year: Not on file  Transportation Needs:   . Lack of Transportation (Medical): Not on file  . Lack of Transportation (Non-Medical): Not on file  Physical Activity:   . Days of Exercise per Week: Not on file  . Minutes of Exercise per Session: Not on file  Stress:   .  Feeling of Stress : Not on file  Social Connections:   . Frequency of Communication with Friends and Family: Not on file  . Frequency of Social Gatherings with Friends and Family: Not on file  . Attends Religious Services: Not on file  . Active Member of Clubs or Organizations: Not on file  . Attends Archivist Meetings: Not on file  . Marital Status: Not on file    Family History  Problem Relation Age of Onset  . Endometrial cancer Mother 31  . Hypertension Father   . Clotting disorder Father   . Atrial fibrillation Father   . Diabetes Paternal Grandmother   . Heart disease Paternal Grandmother 13  . Colon cancer Neg Hx   . Esophageal cancer Neg Hx   . Rectal cancer Neg Hx   . Stomach cancer Neg Hx     Review of Systems  Constitutional: Negative for chills and fever.  Eyes: Negative for visual disturbance.  Respiratory: Negative for cough, shortness of breath and wheezing.   Cardiovascular: Positive for palpitations (occ - ?menopausal) and leg swelling (mild, later in day). Negative for chest pain.  Gastrointestinal: Positive for constipation. Negative for abdominal pain, blood in stool, diarrhea and nausea.       N o gerd  Genitourinary: Negative for dysuria and hematuria.  Musculoskeletal: Negative for back pain.       Stiffness  Skin: Positive for rash (on legs).  Neurological: Negative for light-headedness and headaches.  Psychiatric/Behavioral: Negative for dysphoric mood. The patient is not nervous/anxious.        Objective:   Vitals:   06/18/20 1419  BP: 134/82  Pulse: 62  Temp: 98.5 F (36.9 C)  SpO2: 96%   Filed Weights   06/18/20 1419  Weight: 215 lb 3.2 oz (97.6 kg)   Body mass index is 32.25 kg/m.  BP Readings from Last 3 Encounters:  06/18/20 134/82  01/16/20 136/73  05/07/19 140/73    Wt Readings from Last 3 Encounters:  06/18/20 215 lb 3.2 oz (97.6 kg)  02/26/19 212 lb (96.2 kg)  01/03/19 208 lb (94.3 kg)     Physical  Exam Constitutional: She appears well-developed and well-nourished. No distress.  HENT:  Head: Normocephalic and atraumatic.  Right Ear: External ear normal. Normal ear canal and TM Left Ear: External ear normal.  Normal ear canal and TM Mouth/Throat: Oropharynx is clear and moist.  Eyes: Conjunctivae and EOM are normal.  Neck: Neck supple. No tracheal deviation present. No thyromegaly present.  No carotid bruit  Cardiovascular: Normal rate,  regular rhythm and normal heart sounds.   No murmur heard.  No edema. Pulmonary/Chest: Effort normal and breath sounds normal. No respiratory distress. She has no wheezes. She has no rales.  Breast: deferred   Abdominal: Soft. She exhibits no distension. There is no tenderness.  Lymphadenopathy: She has no cervical adenopathy.  Skin: Skin is warm and dry. She is not diaphoretic.  Psychiatric: She has a normal mood and affect. Her behavior is normal.   The 10-year ASCVD risk score Mikey Bussing DC Jr., et al., 2013) is: 4.6%   Values used to calculate the score:     Age: 74 years     Sex: Female     Is Non-Hispanic African American: No     Diabetic: No     Tobacco smoker: No     Systolic Blood Pressure: 415 mmHg     Is BP treated: Yes     HDL Cholesterol: 56.1 mg/dL     Total Cholesterol: 244 mg/dL      Assessment & Plan:   Physical exam: Screening blood work    reviewed Immunizations  Flu vaccine today, others up to date Colonoscopy  Up to date  Mammogram  Up to date  Gyn  Dr Benjie Karvonen, up to date Eye exams   Due - will schedule Exercise  Hiking 2-3 miles a day Weight     Work on weight loss Substance abuse  None Sees derm annually  See Problem List for Assessment and Plan of chronic medical problems.   This visit occurred during the SARS-CoV-2 public health emergency.  Safety protocols were in place, including screening questions prior to the visit, additional usage of staff PPE, and extensive cleaning of exam room while observing  appropriate contact time as indicated for disinfecting solutions.

## 2020-06-17 NOTE — Patient Instructions (Addendum)
Blood work was reviewed.     All other Health Maintenance issues reviewed.   All recommended immunizations and age-appropriate screenings are up-to-date or discussed.  Flu immunization administered today.    Medications reviewed and updated.  Changes include :  none     Please followup in 1 year    Health Maintenance, Female Adopting a healthy lifestyle and getting preventive care are important in promoting health and wellness. Ask your health care provider about:  The right schedule for you to have regular tests and exams.  Things you can do on your own to prevent diseases and keep yourself healthy. What should I know about diet, weight, and exercise? Eat a healthy diet   Eat a diet that includes plenty of vegetables, fruits, low-fat dairy products, and lean protein.  Do not eat a lot of foods that are high in solid fats, added sugars, or sodium. Maintain a healthy weight Body mass index (BMI) is used to identify weight problems. It estimates body fat based on height and weight. Your health care provider can help determine your BMI and help you achieve or maintain a healthy weight. Get regular exercise Get regular exercise. This is one of the most important things you can do for your health. Most adults should:  Exercise for at least 150 minutes each week. The exercise should increase your heart rate and make you sweat (moderate-intensity exercise).  Do strengthening exercises at least twice a week. This is in addition to the moderate-intensity exercise.  Spend less time sitting. Even light physical activity can be beneficial. Watch cholesterol and blood lipids Have your blood tested for lipids and cholesterol at 58 years of age, then have this test every 5 years. Have your cholesterol levels checked more often if:  Your lipid or cholesterol levels are high.  You are older than 57 years of age.  You are at high risk for heart disease. What should I know about cancer  screening? Depending on your health history and family history, you may need to have cancer screening at various ages. This may include screening for:  Breast cancer.  Cervical cancer.  Colorectal cancer.  Skin cancer.  Lung cancer. What should I know about heart disease, diabetes, and high blood pressure? Blood pressure and heart disease  High blood pressure causes heart disease and increases the risk of stroke. This is more likely to develop in people who have high blood pressure readings, are of African descent, or are overweight.  Have your blood pressure checked: ? Every 3-5 years if you are 68-49 years of age. ? Every year if you are 71 years old or older. Diabetes Have regular diabetes screenings. This checks your fasting blood sugar level. Have the screening done:  Once every three years after age 83 if you are at a normal weight and have a low risk for diabetes.  More often and at a younger age if you are overweight or have a high risk for diabetes. What should I know about preventing infection? Hepatitis B If you have a higher risk for hepatitis B, you should be screened for this virus. Talk with your health care provider to find out if you are at risk for hepatitis B infection. Hepatitis C Testing is recommended for:  Everyone born from [birth year] through 1964.  Anyone with known risk factors for hepatitis C. Sexually transmitted infections (STIs)  Get screened for STIs, including gonorrhea and chlamydia, if: ? You are sexually active and are younger than 59  years of age. ? You are older than 58 years of age and your health care provider tells you that you are at risk for this type of infection. ? Your sexual activity has changed since you were last screened, and you are at increased risk for chlamydia or gonorrhea. Ask your health care provider if you are at risk.  Ask your health care provider about whether you are at high risk for HIV. Your health care provider may  recommend a prescription medicine to help prevent HIV infection. If you choose to take medicine to prevent HIV, you should first get tested for HIV. You should then be tested every 3 months for as long as you are taking the medicine. Pregnancy  If you are about to stop having your period (premenopausal) and you may become pregnant, seek counseling before you get pregnant.  Take 400 to 800 micrograms (mcg) of folic acid every day if you become pregnant.  Ask for birth control (contraception) if you want to prevent pregnancy. Osteoporosis and menopause Osteoporosis is a disease in which the bones lose minerals and strength with aging. This can result in bone fractures. If you are 95 years old or older, or if you are at risk for osteoporosis and fractures, ask your health care provider if you should:  Be screened for bone loss.  Take a calcium or vitamin D supplement to lower your risk of fractures.  Be given hormone replacement therapy (HRT) to treat symptoms of menopause. Follow these instructions at home: Lifestyle  Do not use any products that contain nicotine or tobacco, such as cigarettes, e-cigarettes, and chewing tobacco. If you need help quitting, ask your health care provider.  Do not use street drugs.  Do not share needles.  Ask your health care provider for help if you need support or information about quitting drugs. Alcohol use  Do not drink alcohol if: ? Your health care provider tells you not to drink. ? You are pregnant, may be pregnant, or are planning to become pregnant.  If you drink alcohol: ? Limit how much you use to 0-1 drink a day. ? Limit intake if you are breastfeeding.  Be aware of how much alcohol is in your drink. In the [place] one drink equals one 12 oz bottle of beer (355 mL), one 5 oz glass of wine (148 mL), or one 1 oz glass of hard liquor (44 mL). General instructions  Schedule regular health, dental, and eye exams.  Stay current with your  vaccines.  Tell your health care provider if: ? You often feel depressed. ? You have ever been abused or do not feel safe at home. Summary  Adopting a healthy lifestyle and getting preventive care are important in promoting health and wellness.  Follow your health care provider's instructions about healthy diet, exercising, and getting tested or screened for diseases.  Follow your health care provider's instructions on monitoring your cholesterol and blood pressure. This information is not intended to replace advice given to you by your health care provider. Make sure you discuss any questions you have with your health care provider. Document Revised: 09/06/2018 Document Reviewed: 09/06/2018 Elsevier Patient Education  2020 [employer].

## 2020-06-17 NOTE — Addendum Note (Signed)
Addended by: Trenda Moots on: 0/27/1423 08:16 AM   Modules accepted: Orders

## 2020-06-18 ENCOUNTER — Other Ambulatory Visit: Payer: Self-pay

## 2020-06-18 ENCOUNTER — Encounter: Payer: Self-pay | Admitting: Internal Medicine

## 2020-06-18 ENCOUNTER — Ambulatory Visit (INDEPENDENT_AMBULATORY_CARE_PROVIDER_SITE_OTHER): Payer: 59 | Admitting: Internal Medicine

## 2020-06-18 VITALS — BP 134/82 | HR 62 | Temp 98.5°F | Ht 68.5 in | Wt 215.2 lb

## 2020-06-18 DIAGNOSIS — I10 Essential (primary) hypertension: Secondary | ICD-10-CM

## 2020-06-18 DIAGNOSIS — Z0000 Encounter for general adult medical examination without abnormal findings: Secondary | ICD-10-CM

## 2020-06-18 DIAGNOSIS — E038 Other specified hypothyroidism: Secondary | ICD-10-CM | POA: Diagnosis not present

## 2020-06-18 DIAGNOSIS — E782 Mixed hyperlipidemia: Secondary | ICD-10-CM

## 2020-06-18 DIAGNOSIS — Z23 Encounter for immunization: Secondary | ICD-10-CM

## 2020-06-18 DIAGNOSIS — E063 Autoimmune thyroiditis: Secondary | ICD-10-CM

## 2020-06-18 NOTE — Assessment & Plan Note (Signed)
Chronic BP well controlled Current regimen effective and well tolerated Continue current medications at current doses cmp normal

## 2020-06-18 NOTE — Assessment & Plan Note (Signed)
Chronic tsh WNL Continue current dose of levothyroxine

## 2020-06-18 NOTE — Assessment & Plan Note (Signed)
Chronic Lipids elevated Low ASCVD risk Continue exercise Will work on diet and weight loss

## 2020-06-24 ENCOUNTER — Other Ambulatory Visit: Payer: Self-pay | Admitting: Internal Medicine

## 2020-06-25 ENCOUNTER — Other Ambulatory Visit: Payer: Self-pay | Admitting: Internal Medicine

## 2020-07-03 ENCOUNTER — Encounter: Payer: Self-pay | Admitting: Podiatry

## 2020-07-03 ENCOUNTER — Ambulatory Visit (INDEPENDENT_AMBULATORY_CARE_PROVIDER_SITE_OTHER): Payer: 59 | Admitting: Podiatry

## 2020-07-03 ENCOUNTER — Other Ambulatory Visit: Payer: Self-pay

## 2020-07-03 DIAGNOSIS — M722 Plantar fascial fibromatosis: Secondary | ICD-10-CM | POA: Diagnosis not present

## 2020-07-05 NOTE — Progress Notes (Signed)
Subjective:   Patient ID: Caroline Wall, female   DOB: 59 y.o.   MRN: [id number]   HPI Patient states that the pain in the bottom of the right heel on the outside has become more bothersome in the area we did the surgery is doing great   ROS      Objective:  Physical Exam  Neurovascular status intact with patient's plantar pain medial resolved but quite a bit of discomfort in the lateral band of the plantar fascia     Assessment:  Acute plantar fasciitis lateral band right     Plan:  Reviewed condition sterile prep done and injected the lateral band 3 mg Kenalog 5 mg Xylocaine advised on boot usage for periods of time and discussed the possibilities long-term for other treatments depending on response.  Hopefully she will respond any issues she is to let us know and peroneal tendinitis has resolved currently

## 2020-10-06 ENCOUNTER — Other Ambulatory Visit: Payer: 59

## 2020-10-06 DIAGNOSIS — Z20822 Contact with and (suspected) exposure to covid-19: Secondary | ICD-10-CM

## 2020-10-07 ENCOUNTER — Encounter: Payer: Self-pay | Admitting: Internal Medicine

## 2020-10-09 LAB — SARS-COV-2, NAA 2 DAY TAT

## 2020-10-09 LAB — NOVEL CORONAVIRUS, NAA: SARS-CoV-2, NAA: NOT DETECTED

## 2020-12-23 ENCOUNTER — Other Ambulatory Visit: Payer: Self-pay | Admitting: Obstetrics & Gynecology

## 2020-12-23 ENCOUNTER — Encounter: Payer: Self-pay | Admitting: Internal Medicine

## 2020-12-23 DIAGNOSIS — N6002 Solitary cyst of left breast: Secondary | ICD-10-CM

## 2021-01-22 ENCOUNTER — Other Ambulatory Visit: Payer: 59

## 2021-01-23 ENCOUNTER — Other Ambulatory Visit: Payer: Self-pay

## 2021-01-23 ENCOUNTER — Ambulatory Visit
Admission: RE | Admit: 2021-01-23 | Discharge: 2021-01-23 | Disposition: A | Discharge status: Home / Self Care | Payer: 59 | Source: Ambulatory Visit | Attending: Obstetrics & Gynecology | Admitting: Obstetrics & Gynecology

## 2021-01-23 DIAGNOSIS — N6002 Solitary cyst of left breast: Secondary | ICD-10-CM

## 2021-01-26 ENCOUNTER — Other Ambulatory Visit: Payer: 59

## 2021-01-26 ENCOUNTER — Encounter: Payer: Self-pay | Admitting: Internal Medicine

## 2021-01-26 ENCOUNTER — Other Ambulatory Visit: Payer: Self-pay | Admitting: Internal Medicine

## 2021-01-26 DIAGNOSIS — R5381 Other malaise: Secondary | ICD-10-CM

## 2021-01-27 ENCOUNTER — Other Ambulatory Visit: Payer: Self-pay | Admitting: Internal Medicine

## 2021-01-27 DIAGNOSIS — Z1231 Encounter for screening mammogram for malignant neoplasm of breast: Secondary | ICD-10-CM

## 2021-02-19 ENCOUNTER — Other Ambulatory Visit: Payer: 59

## 2021-02-24 ENCOUNTER — Encounter: Payer: Self-pay | Admitting: Internal Medicine

## 2021-02-25 NOTE — Telephone Encounter (Signed)
Added COVID booster to immunization record.

## 2021-03-31 ENCOUNTER — Ambulatory Visit
Admission: RE | Admit: 2021-03-31 | Discharge: 2021-03-31 | Disposition: A | Discharge status: Home / Self Care | Payer: 59 | Source: Ambulatory Visit | Attending: Internal Medicine | Admitting: Internal Medicine

## 2021-03-31 ENCOUNTER — Other Ambulatory Visit: Payer: Self-pay

## 2021-03-31 DIAGNOSIS — Z1231 Encounter for screening mammogram for malignant neoplasm of breast: Secondary | ICD-10-CM

## 2021-06-20 ENCOUNTER — Other Ambulatory Visit: Payer: Self-pay | Admitting: Internal Medicine

## 2021-06-30 ENCOUNTER — Encounter: Payer: Self-pay | Admitting: Internal Medicine

## 2021-06-30 DIAGNOSIS — Z833 Family history of diabetes mellitus: Secondary | ICD-10-CM

## 2021-06-30 DIAGNOSIS — I10 Essential (primary) hypertension: Secondary | ICD-10-CM

## 2021-06-30 DIAGNOSIS — Z0000 Encounter for general adult medical examination without abnormal findings: Secondary | ICD-10-CM

## 2021-06-30 DIAGNOSIS — E038 Other specified hypothyroidism: Secondary | ICD-10-CM

## 2021-06-30 DIAGNOSIS — E782 Mixed hyperlipidemia: Secondary | ICD-10-CM

## 2021-07-02 ENCOUNTER — Other Ambulatory Visit: Payer: Self-pay

## 2021-07-02 ENCOUNTER — Other Ambulatory Visit (INDEPENDENT_AMBULATORY_CARE_PROVIDER_SITE_OTHER): Payer: 59

## 2021-07-02 DIAGNOSIS — I10 Essential (primary) hypertension: Secondary | ICD-10-CM

## 2021-07-02 DIAGNOSIS — Z833 Family history of diabetes mellitus: Secondary | ICD-10-CM | POA: Diagnosis not present

## 2021-07-02 DIAGNOSIS — E038 Other specified hypothyroidism: Secondary | ICD-10-CM | POA: Diagnosis not present

## 2021-07-02 DIAGNOSIS — E782 Mixed hyperlipidemia: Secondary | ICD-10-CM | POA: Diagnosis not present

## 2021-07-02 DIAGNOSIS — Z0000 Encounter for general adult medical examination without abnormal findings: Secondary | ICD-10-CM | POA: Diagnosis not present

## 2021-07-02 DIAGNOSIS — E063 Autoimmune thyroiditis: Secondary | ICD-10-CM

## 2021-07-02 LAB — CBC WITH DIFFERENTIAL/PLATELET
Basophils Absolute: 0.1 10*3/uL (ref 0.0–0.1)
Basophils Relative: 1.3 % (ref 0.0–3.0)
Eosinophils Absolute: 0.1 10*3/uL (ref 0.0–0.7)
Eosinophils Relative: 2.7 % (ref 0.0–5.0)
HCT: 43.5 % (ref 36.0–46.0)
Hemoglobin: 14.9 g/dL (ref 12.0–15.0)
Lymphocytes Relative: 30.2 % (ref 12.0–46.0)
Lymphs Abs: 1.3 10*3/uL (ref 0.7–4.0)
MCHC: 34.3 g/dL (ref 30.0–36.0)
MCV: 90.9 fl (ref 78.0–100.0)
Monocytes Absolute: 0.4 10*3/uL (ref 0.1–1.0)
Monocytes Relative: 9.4 % (ref 3.0–12.0)
Neutro Abs: 2.3 10*3/uL (ref 1.4–7.7)
Neutrophils Relative %: 56.4 % (ref 43.0–77.0)
Platelets: 164 10*3/uL (ref 150.0–400.0)
RBC: 4.78 Mil/uL (ref 3.87–5.11)
RDW: 13 % (ref 11.5–15.5)
WBC: 4.2 10*3/uL (ref 4.0–10.5)

## 2021-07-02 LAB — COMPREHENSIVE METABOLIC PANEL
ALT: 20 U/L (ref 0–35)
AST: 20 U/L (ref 0–37)
Albumin: 4.5 g/dL (ref 3.5–5.2)
Alkaline Phosphatase: 43 U/L (ref 39–117)
BUN: 15 mg/dL (ref 6–23)
CO2: 32 mEq/L (ref 19–32)
Calcium: 9.6 mg/dL (ref 8.4–10.5)
Chloride: 102 mEq/L (ref 96–112)
Creatinine, Ser: 0.87 mg/dL (ref 0.40–1.20)
GFR: 73.1 mL/min (ref >60.00)
Glucose, Bld: 92 mg/dL (ref 70–99)
Potassium: 4.1 mEq/L (ref 3.5–5.1)
Sodium: 140 mEq/L (ref 135–145)
Total Bilirubin: 0.7 mg/dL (ref 0.2–1.2)
Total Protein: 6.9 g/dL (ref 6.0–8.3)

## 2021-07-02 LAB — LIPID PANEL
Cholesterol: 238 mg/dL — ABNORMAL HIGH (ref 0–200)
HDL: 63.8 mg/dL (ref >39.00)
LDL Cholesterol: 153 mg/dL — ABNORMAL HIGH (ref 0–99)
NonHDL: 174.53
Total CHOL/HDL Ratio: 4
Triglycerides: 108 mg/dL (ref 0.0–149.0)
VLDL: 21.6 mg/dL (ref 0.0–40.0)

## 2021-07-02 LAB — TSH: TSH: 3.25 u[IU]/mL (ref 0.35–5.50)

## 2021-07-02 LAB — HEMOGLOBIN A1C: Hgb A1c MFr Bld: 5 % (ref 4.6–6.5)

## 2021-07-08 NOTE — Progress Notes (Signed)
Subjective:    Patient ID: Caroline Wall, female    DOB: 04/29/1961, 60 y.o.   MRN: [id number]   This visit occurred during the SARS-CoV-2 public health emergency.  Safety protocols were in place, including screening questions prior to the visit, additional usage of staff PPE, and extensive cleaning of exam room while observing appropriate contact time as indicated for disinfecting solutions.    HPI She is here for a physical exam.   She had a blood work done already.   She has no concerns.  Medications and allergies reviewed with patient and updated if appropriate.  Patient Active Problem List   Diagnosis Date Noted   Family history of diabetes mellitus in grandmother 02/26/2019   Hand arthritis 02/26/2019   Rash and nonspecific skin eruption 02/26/2019   Constipation 02/26/2019   Fatigue 12/05/2018   Tinea versicolor 12/05/2018   Left-sided low back pain without sciatica 12/05/2018   Hypothyroidism 02/16/2018   Thyroid antibody positive 03/22/2017   Hyperlipidemia 03/13/2016   Palpitations 02/16/2016   Basal cell carcinoma 12/31/2014   Personal history of malignant melanoma 09/03/2013   Hypertension     Current Outpatient Medications on File Prior to Visit  Medication Sig Dispense Refill   Ascorbic Acid (VITAMIN C) 1000 MG tablet Take 1,000 mg by mouth daily.     Cholecalciferol (VITAMIN D) 10 MCG/ML LIQD      hydrochlorothiazide (HYDRODIURIL) 25 MG tablet TAKE 1 TABLET DAILY 90 tablet 3   levothyroxine (SYNTHROID) 75 MCG tablet TAKE 1 TABLET DAILY 90 tablet 3   Multiple Vitamin (MULTIVITAMIN) tablet Take 1 tablet by mouth daily.     Multiple Vitamins-Minerals (ONE DAILY CALCIUM/IRON) TABS Take by mouth.     Omega-3 Fatty Acids (FISH OIL) 1000 MG CAPS Take by mouth daily.     Turmeric 500 MG CAPS Take by mouth.     No current facility-administered medications on file prior to visit.    Past Medical History:  Diagnosis Date   BCC (basal cell carcinoma), leg  11/2014   following at [facility] derm for same   Depression    prior tx failure with sertraline and wellbutrin   Depression    Elevated TSH 02/15/2017   GERD (gastroesophageal reflux disease)    in past   Heart murmur    Hypertension    Melanoma ([place]) 1996   L low back - follows annually for body scan [facility]    Past Surgical History:  Procedure Laterality Date   ABDOMINAL HYSTERECTOMY  2007   FOOT SURGERY     left   SKIN CANCER EXCISION     [place] 11/2014   TONSILLECTOMY      Social History   Socioeconomic History   Marital status: Married    Spouse name: Not on file   Number of children: 2   Years of education: Not on file   Highest education level: Not on file  Occupational History   Occupation: [employer]    Employer: [employer]  Tobacco Use   Smoking status: Former    Types: Cigarettes    Quit date: 09/27/1989    Years since quitting: 31.8   Smokeless tobacco: Never  Substance and Sexual Activity   Alcohol use: Yes    Comment: diet tonic and gin - decreased intake   Drug use: No   Sexual activity: Not on file  Other Topics Concern   Not on file  Social History  Narrative   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 [employer] 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 (married 06/2013) and their 2 adopted children   Social Determinants of Health   Financial Resource Strain: Not on file  Food Insecurity: Not on file  Transportation Needs: Not on file  Physical Activity: Not on file  Stress: Not on file  Social Connections: Not on file    Family History  Problem Relation Age of Onset   Endometrial cancer Mother 60   Hypertension Father    Clotting disorder Father    Atrial fibrillation Father    Diabetes Paternal Grandmother    Heart disease Paternal Grandmother 90   Colon cancer Neg Hx    Esophageal cancer Neg Hx    Rectal  cancer Neg Hx    Stomach cancer Neg Hx     Review of Systems  Constitutional:  Negative for chills and fever.  Eyes:  Negative for visual disturbance.  Respiratory:  Negative for cough, shortness of breath and wheezing.   Cardiovascular:  Positive for leg swelling (mild). Negative for chest pain and palpitations.  Gastrointestinal:  Positive for constipation (mild). Negative for abdominal pain, blood in stool, diarrhea and nausea.       No gerd  Genitourinary:  Negative for dysuria.  Musculoskeletal:  Positive for arthralgias (mild). Negative for back pain.  Skin:  Negative for color change and rash.  Neurological:  Negative for dizziness, light-headedness, numbness and headaches.  Psychiatric/Behavioral:  Negative for dysphoric mood. The patient is not nervous/anxious.       Objective:   Vitals:   07/09/21 0830  BP: 130/76  Pulse: 60  Temp: 98.6 F (37 C)  SpO2: 97%   Filed Weights   07/09/21 0830  Weight: 210 lb (95.3 kg)   Body mass index is 31.47 kg/m.  BP Readings from Last 3 Encounters:  07/09/21 130/76  06/18/20 134/82  01/16/20 136/73    Wt Readings from Last 3 Encounters:  07/09/21 210 lb (95.3 kg)  06/18/20 215 lb 3.2 oz (97.6 kg)  02/26/19 212 lb (96.2 kg)    Depression screen [facility] 2/9 07/09/2021 02/26/2019 12/05/2018  Decreased Interest 0 0 0  Down, Depressed, Hopeless 0 0 0  PHQ - 2 Score 0 0 0  Altered sleeping 0 - -  Tired, decreased energy 0 - -  Change in appetite 0 - -  Feeling bad or failure about yourself  0 - -  Trouble concentrating 0 - -  Moving slowly or fidgety/restless 0 - -  Suicidal thoughts 0 - -  PHQ-9 Score 0 - -    GAD 7 : Generalized Anxiety Score 07/09/2021  Nervous, Anxious, on Edge 0  Control/stop worrying 0  Worry too much - different things 0  Trouble relaxing 0  Restless 0  Easily annoyed or irritable 0  Afraid - awful might happen 0  Total GAD 7 Score 0       Physical Exam Constitutional: She appears  well-developed and well-nourished. No distress.  HENT:  Head: Normocephalic and atraumatic.  Right Ear: External ear normal. Normal ear canal and TM Left Ear: External ear normal.  Normal ear canal and TM Mouth/Throat: Oropharynx is clear and moist.  Eyes: Conjunctivae and EOM are normal.  Neck: Neck supple. No tracheal deviation present. No thyromegaly present.  No carotid bruit  Cardiovascular: Normal rate, regular rhythm and normal heart sounds.  No murmur heard.  No edema. Pulmonary/Chest: Effort normal and breath sounds normal. No respiratory distress. She has no wheezes. She has no rales.  Breast: deferred   Abdominal: Soft. She exhibits no distension. There is no tenderness.  Lymphadenopathy: She has no cervical adenopathy.  Skin: Skin is warm and dry. She is not diaphoretic.  Psychiatric: She has a normal mood and affect. Her behavior is normal.     Lab Results  Component Value Date   WBC 4.2 07/02/2021   HGB 14.9 07/02/2021   HCT 43.5 07/02/2021   PLT 164.0 07/02/2021   GLUCOSE 92 07/02/2021   CHOL 238 (H) 07/02/2021   TRIG 108.0 07/02/2021   HDL 63.80 07/02/2021   LDLDIRECT 145.0 02/26/2019   LDLCALC 153 (H) 07/02/2021   ALT 20 07/02/2021   AST 20 07/02/2021   NA 140 07/02/2021   K 4.1 07/02/2021   CL 102 07/02/2021   CREATININE 0.87 07/02/2021   BUN 15 07/02/2021   CO2 32 07/02/2021   TSH 3.25 07/02/2021   HGBA1C 5.0 07/02/2021    The 10-year ASCVD risk score (Arnett DK, et al., 2019) is: 4.3%   Values used to calculate the score:     Age: 59 years     Sex: Female     Is Non-Hispanic African American: No     Diabetic: No     Tobacco smoker: No     Systolic Blood Pressure: 161 mmHg     Is BP treated: Yes     HDL Cholesterol: 63.8 mg/dL     Total Cholesterol: 238 mg/dL      Assessment & Plan:   Physical exam: Screening blood work reviewed Exercise  regular when not traveling - classes at senior center, walks dog Weight  working on weight  loss Substance abuse  none   Screened for depression using the PHQ 9 scale.  No evidence of depression.   Screened for anxiety using GAD7 Scale.  No evidence of anxiety.   Reviewed recommended immunizations.   Health Maintenance  Topic Date Due   PAP SMEAR-Modifier  12/27/2018   INFLUENZA VACCINE  04/27/2021   COVID-19 Vaccine (5 - Booster for Pfizer series) 06/11/2021   MAMMOGRAM  04/01/2023   COLONOSCOPY (Pts 45-75yrs Insurance coverage will need to be confirmed)  09/15/2025   TETANUS/TDAP  02/15/2026   Hepatitis C Screening  Completed   HIV Screening  Completed   Zoster Vaccines- Shingrix  Completed   HPV VACCINES  Aged Out          See Problem List for Assessment and Plan of chronic medical problems.

## 2021-07-08 NOTE — Patient Instructions (Addendum)
Flu immunization administered today.     Medications changes include :   none    Please followup in 1 year   Health Maintenance, Female Adopting a healthy lifestyle and getting preventive care are important in promoting health and wellness. Ask your health care provider about: The right schedule for you to have regular tests and exams. Things you can do on your own to prevent diseases and keep yourself healthy. What should I know about diet, weight, and exercise? Eat a healthy diet  Eat a diet that includes plenty of vegetables, fruits, low-fat dairy products, and lean protein. Do not eat a lot of foods that are high in solid fats, added sugars, or sodium. Maintain a healthy weight Body mass index (BMI) is used to identify weight problems. It estimates body fat based on height and weight. Your health care provider can help determine your BMI and help you achieve or maintain a healthy weight. Get regular exercise Get regular exercise. This is one of the most important things you can do for your health. Most adults should: Exercise for at least 150 minutes each week. The exercise should increase your heart rate and make you sweat (moderate-intensity exercise). Do strengthening exercises at least twice a week. This is in addition to the moderate-intensity exercise. Spend less time sitting. Even light physical activity can be beneficial. Watch cholesterol and blood lipids Have your blood tested for lipids and cholesterol at 59 years of age, then have this test every 5 years. Have your cholesterol levels checked more often if: Your lipid or cholesterol levels are high. You are older than 59 years of age. You are at high risk for heart disease. What should I know about cancer screening? Depending on your health history and family history, you may need to have cancer screening at various ages. This may include screening for: Breast cancer. Cervical cancer. Colorectal cancer. Skin  cancer. Lung cancer. What should I know about heart disease, diabetes, and high blood pressure? Blood pressure and heart disease High blood pressure causes heart disease and increases the risk of stroke. This is more likely to develop in people who have high blood pressure readings, are of African descent, or are overweight. Have your blood pressure checked: Every 3-5 years if you are 33-30 years of age. Every year if you are 27 years old or older. Diabetes Have regular diabetes screenings. This checks your fasting blood sugar level. Have the screening done: Once every three years after age 27 if you are at a normal weight and have a low risk for diabetes. More often and at a younger age if you are overweight or have a high risk for diabetes. What should I know about preventing infection? Hepatitis B If you have a higher risk for hepatitis B, you should be screened for this virus. Talk with your health care provider to find out if you are at risk for hepatitis B infection. Hepatitis C Testing is recommended for: Everyone born from [birth year] through 1965. Anyone with known risk factors for hepatitis C. Sexually transmitted infections (STIs) Get screened for STIs, including gonorrhea and chlamydia, if: You are sexually active and are younger than 59 years of age. You are older than 59 years of age and your health care provider tells you that you are at risk for this type of infection. Your sexual activity has changed since you were last screened, and you are at increased risk for chlamydia or gonorrhea. Ask your health care provider if you are  at risk. Ask your health care provider about whether you are at high risk for HIV. Your health care provider may recommend a prescription medicine to help prevent HIV infection. If you choose to take medicine to prevent HIV, you should first get tested for HIV. You should then be tested every 3 months for as long as you are taking the medicine. Pregnancy If  you are about to stop having your period (premenopausal) and you may become pregnant, seek counseling before you get pregnant. Take 400 to 800 micrograms (mcg) of folic acid every day if you become pregnant. Ask for birth control (contraception) if you want to prevent pregnancy. Osteoporosis and menopause Osteoporosis is a disease in which the bones lose minerals and strength with aging. This can result in bone fractures. If you are 53 years old or older, or if you are at risk for osteoporosis and fractures, ask your health care provider if you should: Be screened for bone loss. Take a calcium or vitamin D supplement to lower your risk of fractures. Be given hormone replacement therapy (HRT) to treat symptoms of menopause. Follow these instructions at home: Lifestyle Do not use any products that contain nicotine or tobacco, such as cigarettes, e-cigarettes, and chewing tobacco. If you need help quitting, ask your health care provider. Do not use street drugs. Do not share needles. Ask your health care provider for help if you need support or information about quitting drugs. Alcohol use Do not drink alcohol if: Your health care provider tells you not to drink. You are pregnant, may be pregnant, or are planning to become pregnant. If you drink alcohol: Limit how much you use to 0-1 drink a day. Limit intake if you are breastfeeding. Be aware of how much alcohol is in your drink. In the [place] one drink equals one 12 oz bottle of beer (355 mL), one 5 oz glass of wine (148 mL), or one 1 oz glass of hard liquor (44 mL). General instructions Schedule regular health, dental, and eye exams. Stay current with your vaccines. Tell your health care provider if: You often feel depressed. You have ever been abused or do not feel safe at home. Summary Adopting a healthy lifestyle and getting preventive care are important in promoting health and wellness. Follow your health care provider's  instructions about healthy diet, exercising, and getting tested or screened for diseases. Follow your health care provider's instructions on monitoring your cholesterol and blood pressure. This information is not intended to replace advice given to you by your health care provider. Make sure you discuss any questions you have with your health care provider. Document Revised: 11/21/2020 Document Reviewed: 09/06/2018 Elsevier Patient Education  2022 [employer].

## 2021-07-09 ENCOUNTER — Encounter: Payer: Self-pay | Admitting: Internal Medicine

## 2021-07-09 ENCOUNTER — Other Ambulatory Visit: Payer: Self-pay

## 2021-07-09 ENCOUNTER — Ambulatory Visit (INDEPENDENT_AMBULATORY_CARE_PROVIDER_SITE_OTHER): Payer: 59 | Admitting: Internal Medicine

## 2021-07-09 VITALS — BP 130/76 | HR 60 | Temp 98.6°F | Ht 68.5 in | Wt 210.0 lb

## 2021-07-09 DIAGNOSIS — E063 Autoimmune thyroiditis: Secondary | ICD-10-CM

## 2021-07-09 DIAGNOSIS — Z833 Family history of diabetes mellitus: Secondary | ICD-10-CM

## 2021-07-09 DIAGNOSIS — I10 Essential (primary) hypertension: Secondary | ICD-10-CM

## 2021-07-09 DIAGNOSIS — E782 Mixed hyperlipidemia: Secondary | ICD-10-CM | POA: Diagnosis not present

## 2021-07-09 DIAGNOSIS — Z23 Encounter for immunization: Secondary | ICD-10-CM

## 2021-07-09 DIAGNOSIS — Z0000 Encounter for general adult medical examination without abnormal findings: Secondary | ICD-10-CM

## 2021-07-09 DIAGNOSIS — E038 Other specified hypothyroidism: Secondary | ICD-10-CM

## 2021-07-09 DIAGNOSIS — Z1331 Encounter for screening for depression: Secondary | ICD-10-CM

## 2021-07-09 NOTE — Assessment & Plan Note (Signed)
Chronic Regular exercise and healthy diet encouraged Lipid panel reviewed Lifestyle controlled-ASCVD risk is low so no medication is needed at this time

## 2021-07-09 NOTE — Assessment & Plan Note (Addendum)
Chronic Blood pressure well controlled On occasion her blood pressure is a little higher at home Work on weight loss and healthy eating CMP reviewed Continue HCTZ 25 mg daily

## 2021-07-09 NOTE — Assessment & Plan Note (Addendum)
Chronic  Clinically euthyroid Currently taking levothyroxine 75 mcg daily Lab Results  Component Value Date   TSH 3.25 07/02/2021   TSH in normal range-continue current dose

## 2021-07-09 NOTE — Assessment & Plan Note (Signed)
Lab Results  Component Value Date   HGBA1C 5.0 07/02/2021   Sugars have been in the normal range

## 2021-07-10 NOTE — Addendum Note (Signed)
Addended by: Marcina Millard on: 07/10/2021 09:49 AM   Modules accepted: Orders

## 2021-08-13 ENCOUNTER — Encounter: Payer: Self-pay | Admitting: Internal Medicine

## 2021-08-14 ENCOUNTER — Telehealth (INDEPENDENT_AMBULATORY_CARE_PROVIDER_SITE_OTHER): Payer: 59 | Admitting: Internal Medicine

## 2021-08-14 ENCOUNTER — Other Ambulatory Visit: Payer: Self-pay

## 2021-08-14 ENCOUNTER — Encounter: Payer: Self-pay | Admitting: Internal Medicine

## 2021-08-14 DIAGNOSIS — U071 COVID-19: Secondary | ICD-10-CM

## 2021-08-14 MED ORDER — NIRMATRELVIR/RITONAVIR (PAXLOVID)TABLET: 3.0000 | ORAL_TABLET | Freq: Two times a day (BID) | ORAL | 0 refills | Status: AC

## 2021-08-14 NOTE — Progress Notes (Signed)
Virtual Visit via Video Note  I connected with Caroline Wall on 08/14/21 at 11:20 AM EST by a video enabled telemedicine application and verified that I am speaking with the correct person using two identifiers.   I discussed the limitations of evaluation and management by telemedicine and the availability of in person appointments. The patient expressed understanding and agreed to proceed.  Present for the visit:  Myself, Dr Billey Gosling, Mickel Baas Carneiro.  The patient is currently at home and I am in the office.    No referring provider.    History of Present Illness: This is an acute visit for covid.  She was traveling last week for work and thinks that is where she got COVID.  She was wearing her mask at bedtime.  Symptoms started 2 days ago and she initially tested negative, but yesterday tested positive.  Her initial symptoms were sore throat, runny nose, nasal congestion sniffles.  Her symptoms have worsened since then.  She states chills, low-grade fever, fatigue, body aches and headaches.  She has nasal congestion, sinus pain, sore throat, intermittent cough and some mild wheezing.  At times she does feel pressure or tightness in her chest.  She has been taking ibuprofen.  She is isolating herself in her house from her family.   Review of Systems  Constitutional:  Positive for chills, fever (low grade) and malaise/fatigue.  HENT:  Positive for congestion, sinus pain and sore throat.        No change in taste or smell  Respiratory:  Positive for cough (intermittent) and wheezing (mild). Negative for shortness of breath.        Pressure/tightness in the chest  Musculoskeletal:  Positive for myalgias.  Neurological:  Positive for headaches.     Social History   Socioeconomic History   Marital status: Married    Spouse name: Not on file   Number of children: 2   Years of education: Not on file   Highest education level: Not on file  Occupational History   Occupation: [place] [employer]    Employer: [employer]  Tobacco Use   Smoking status: Former    Types: Cigarettes    Quit date: 09/27/1989    Years since quitting: 31.9   Smokeless tobacco: Never  Substance and Sexual Activity   Alcohol use: Yes    Comment: diet tonic and gin - decreased intake   Drug use: No   Sexual activity: Not on file  Other Topics Concern   Not on file  Social History Narrative   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   Follow Up Instructions:    I discussed the assessment and treatment plan with the patient. The patient was provided an opportunity to ask questions and all were answered. The patient agreed with the plan and demonstrated an understanding of the instructions.   The patient  was advised to call back or seek an in-person evaluation if the symptoms worsen or if the condition fails to improve as anticipated.    [name], MD

## 2021-08-14 NOTE — Assessment & Plan Note (Addendum)
Acute Today is day 3 of symptoms Symptoms mild-moderate in nature Will start paxlovid 3 tabs bid x 5 days  - no interactions with current medications and GFR normal Discussed possible side effects of altered taste, diarrhea Advised symptomatic treatment with otc medications She will call with any questions/concerns

## 2021-08-26 IMAGING — MG MM DIGITAL SCREENING BILAT W/ TOMO AND CAD
8 series · 8 of 24 positions shown · non-contrast
Comparison: Previous exam(s).

CLINICAL DATA: Screening.

EXAM:
DIGITAL SCREENING BILATERAL MAMMOGRAM WITH TOMOSYNTHESIS AND CAD
TECHNIQUE: Bilateral screening digital craniocaudal and mediolateral oblique
mammograms were obtained. Bilateral screening digital breast
tomosynthesis was performed. The images were evaluated with
computer-aided detection.

[L MLO synth-2D]
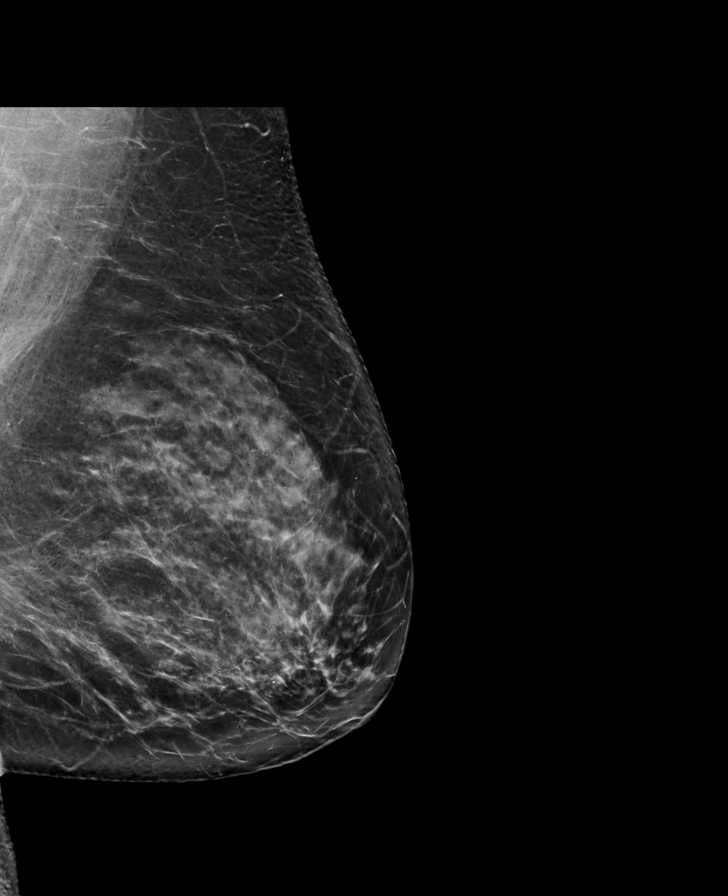

[R CC synth-2D]
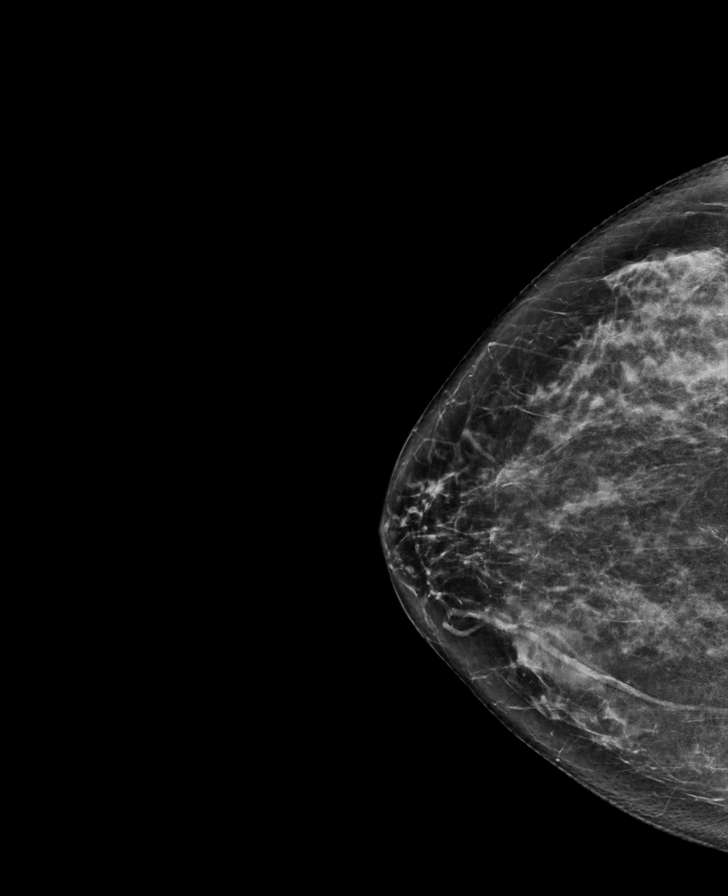

[R MLO synth-2D]
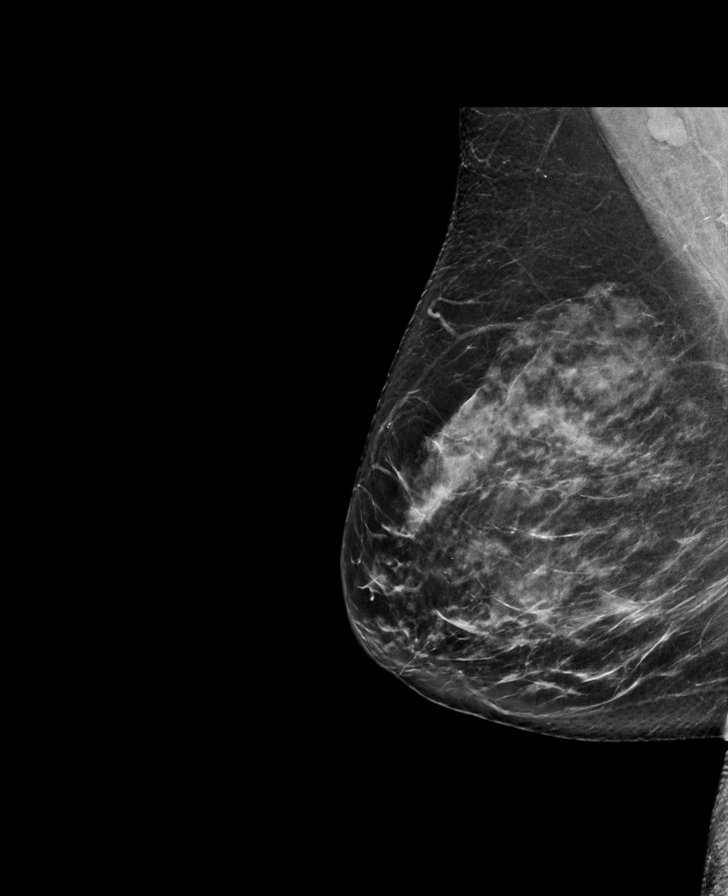

[L CC synth-2D]
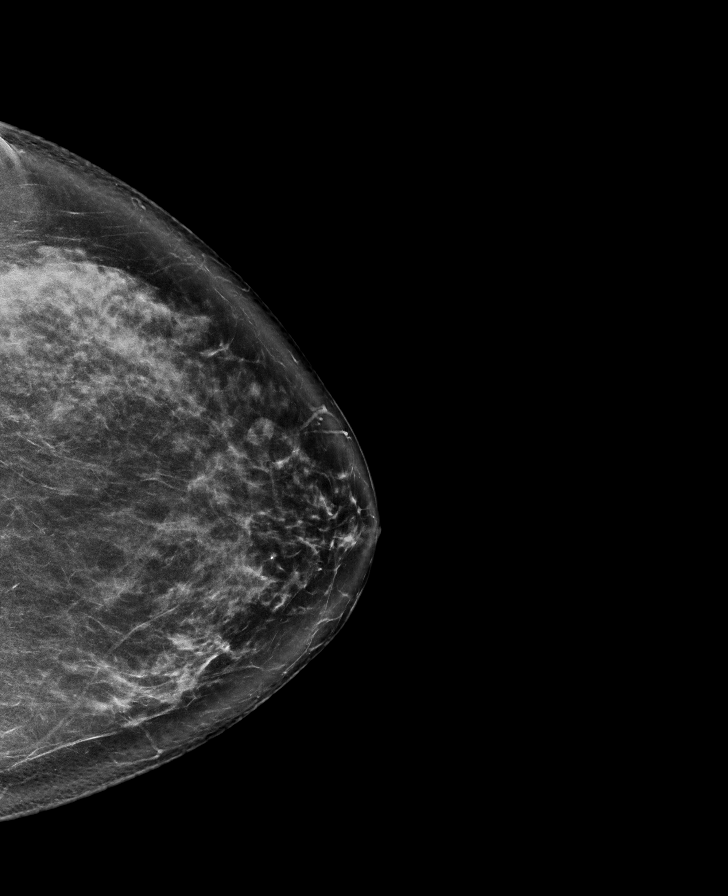

[R CC tomo · tomo slice 37/72.0]
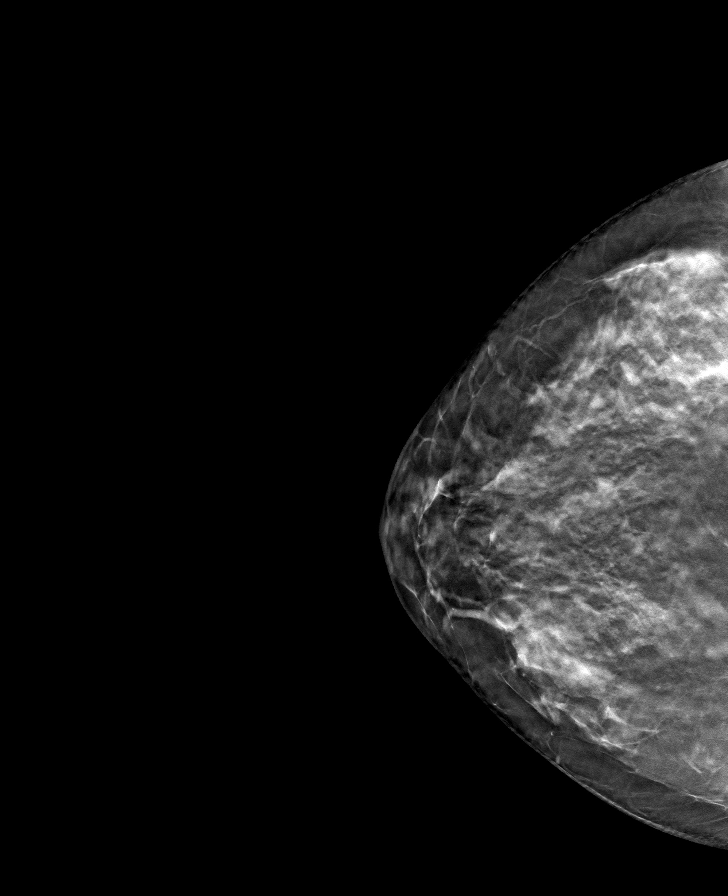

[L MLO tomo · tomo slice 46/91.0]
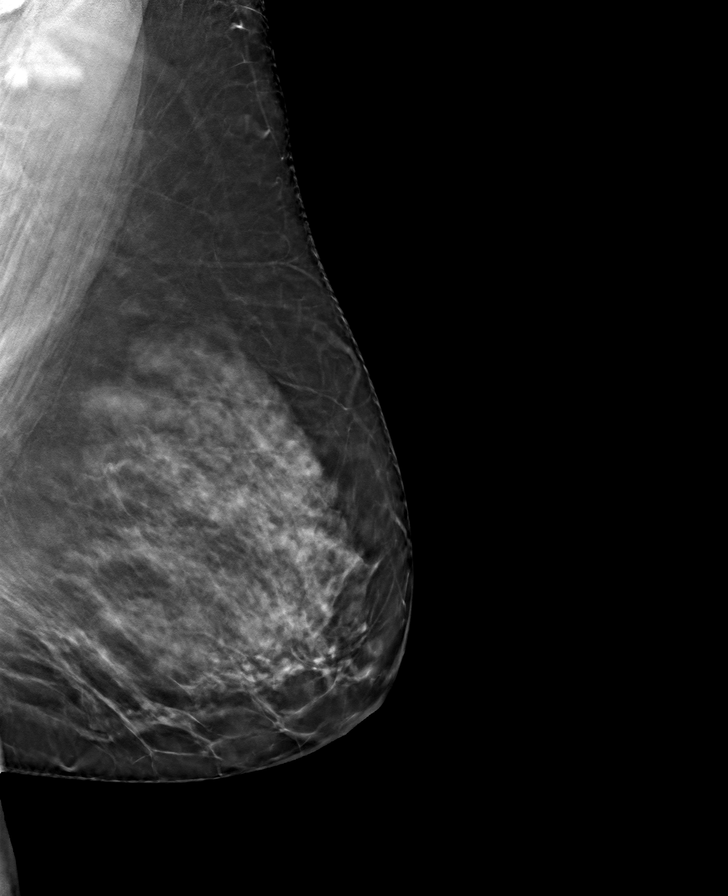

[L CC tomo · tomo slice 43/86.0]
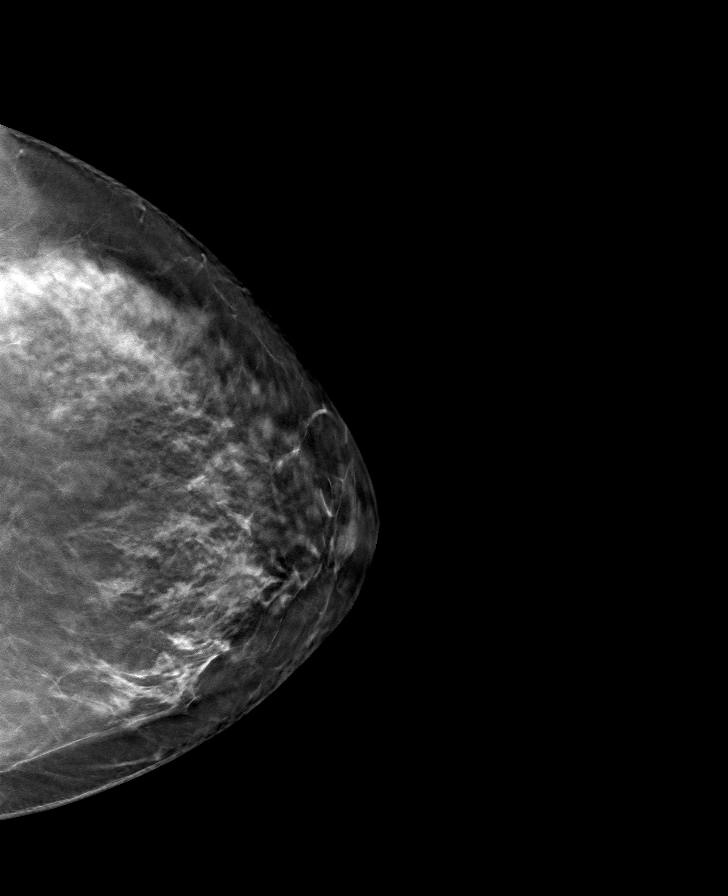

[R MLO tomo · tomo slice 41/82.0]
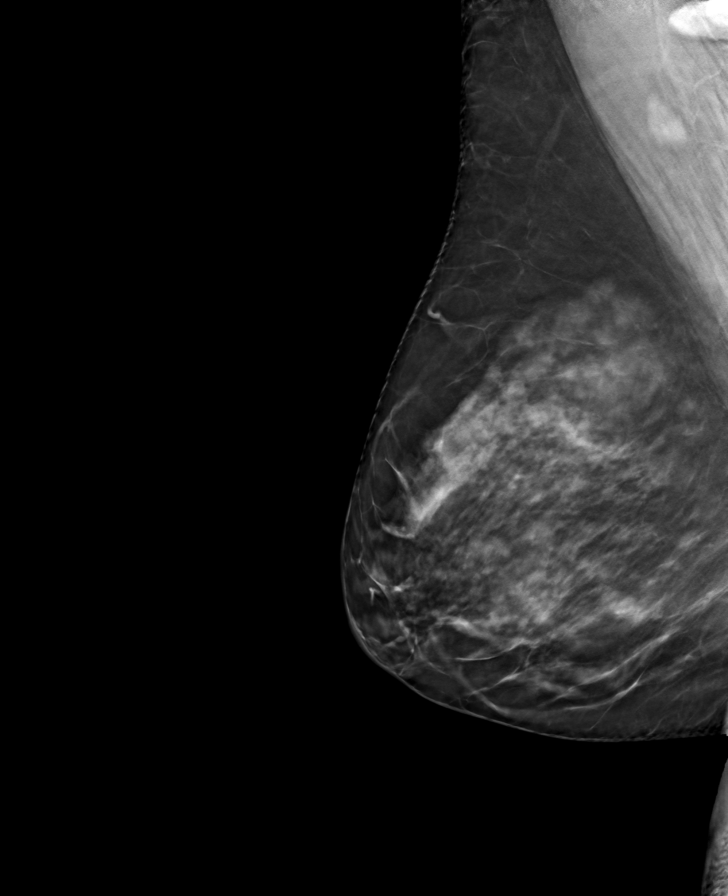

[8 of 24 positions shown; findings below may reference images not displayed]

ACR Breast Density Category c: The breast tissue is heterogeneously
dense, which may obscure small masses.
FINDINGS: There are no findings suspicious for malignancy.
IMPRESSION: No mammographic evidence of malignancy. A result letter of this
screening mammogram will be mailed directly to the patient.

RECOMMENDATION:
Screening mammogram in one year. (Code:[id number])

BI-RADS CATEGORY  1: Negative.

## 2021-09-02 ENCOUNTER — Encounter: Payer: Self-pay | Admitting: Internal Medicine

## 2021-12-03 ENCOUNTER — Encounter: Payer: Self-pay | Admitting: Internal Medicine

## 2022-02-15 ENCOUNTER — Other Ambulatory Visit: Payer: Self-pay | Admitting: Internal Medicine

## 2022-02-15 DIAGNOSIS — Z1231 Encounter for screening mammogram for malignant neoplasm of breast: Secondary | ICD-10-CM

## 2022-04-01 ENCOUNTER — Ambulatory Visit
Admission: RE | Admit: 2022-04-01 | Discharge: 2022-04-01 | Disposition: A | Discharge status: Home / Self Care | Payer: 59 | Source: Ambulatory Visit | Attending: Internal Medicine | Admitting: Internal Medicine

## 2022-04-01 DIAGNOSIS — Z1231 Encounter for screening mammogram for malignant neoplasm of breast: Secondary | ICD-10-CM

## 2022-05-25 ENCOUNTER — Encounter: Payer: Self-pay | Admitting: Internal Medicine

## 2022-05-25 DIAGNOSIS — E782 Mixed hyperlipidemia: Secondary | ICD-10-CM

## 2022-05-25 DIAGNOSIS — I10 Essential (primary) hypertension: Secondary | ICD-10-CM

## 2022-05-25 DIAGNOSIS — Z833 Family history of diabetes mellitus: Secondary | ICD-10-CM

## 2022-05-25 DIAGNOSIS — E038 Other specified hypothyroidism: Secondary | ICD-10-CM

## 2022-06-06 NOTE — Addendum Note (Signed)
Addended by: [name] on: 06/06/2022 04:24 PM   Modules accepted: Orders

## 2022-06-10 ENCOUNTER — Other Ambulatory Visit (INDEPENDENT_AMBULATORY_CARE_PROVIDER_SITE_OTHER): Payer: 59

## 2022-06-10 DIAGNOSIS — Z833 Family history of diabetes mellitus: Secondary | ICD-10-CM | POA: Diagnosis not present

## 2022-06-10 DIAGNOSIS — E038 Other specified hypothyroidism: Secondary | ICD-10-CM

## 2022-06-10 DIAGNOSIS — E782 Mixed hyperlipidemia: Secondary | ICD-10-CM | POA: Diagnosis not present

## 2022-06-10 DIAGNOSIS — E063 Autoimmune thyroiditis: Secondary | ICD-10-CM

## 2022-06-10 DIAGNOSIS — I10 Essential (primary) hypertension: Secondary | ICD-10-CM

## 2022-06-10 LAB — TSH: TSH: 2.3 u[IU]/mL (ref 0.35–5.50)

## 2022-06-10 LAB — COMPREHENSIVE METABOLIC PANEL
ALT: 20 U/L (ref 0–35)
AST: 19 U/L (ref 0–37)
Albumin: 4.4 g/dL (ref 3.5–5.2)
Alkaline Phosphatase: 38 U/L — ABNORMAL LOW (ref 39–117)
BUN: 24 mg/dL — ABNORMAL HIGH (ref 6–23)
CO2: 30 mEq/L (ref 19–32)
Calcium: 9.6 mg/dL (ref 8.4–10.5)
Chloride: 97 mEq/L (ref 96–112)
Creatinine, Ser: 0.77 mg/dL (ref 0.40–1.20)
GFR: 84.08 mL/min (ref >60.00)
Glucose, Bld: 95 mg/dL (ref 70–99)
Potassium: 4.1 mEq/L (ref 3.5–5.1)
Sodium: 135 mEq/L (ref 135–145)
Total Bilirubin: 0.5 mg/dL (ref 0.2–1.2)
Total Protein: 7.1 g/dL (ref 6.0–8.3)

## 2022-06-10 LAB — CBC WITH DIFFERENTIAL/PLATELET
Basophils Absolute: 0 10*3/uL (ref 0.0–0.1)
Basophils Relative: 0.8 % (ref 0.0–3.0)
Eosinophils Absolute: 0.1 10*3/uL (ref 0.0–0.7)
Eosinophils Relative: 2.2 % (ref 0.0–5.0)
HCT: 41.1 % (ref 36.0–46.0)
Hemoglobin: 14.3 g/dL (ref 12.0–15.0)
Lymphocytes Relative: 25.5 % (ref 12.0–46.0)
Lymphs Abs: 1.1 10*3/uL (ref 0.7–4.0)
MCHC: 34.9 g/dL (ref 30.0–36.0)
MCV: 89.9 fl (ref 78.0–100.0)
Monocytes Absolute: 0.4 10*3/uL (ref 0.1–1.0)
Monocytes Relative: 10.2 % (ref 3.0–12.0)
Neutro Abs: 2.5 10*3/uL (ref 1.4–7.7)
Neutrophils Relative %: 61.3 % (ref 43.0–77.0)
Platelets: 180 10*3/uL (ref 150.0–400.0)
RBC: 4.58 Mil/uL (ref 3.87–5.11)
RDW: 13.1 % (ref 11.5–15.5)
WBC: 4.1 10*3/uL (ref 4.0–10.5)

## 2022-06-10 LAB — LIPID PANEL
Cholesterol: 185 mg/dL (ref 0–200)
HDL: 73.6 mg/dL (ref >39.00)
LDL Cholesterol: 99 mg/dL (ref 0–99)
NonHDL: 111.33
Total CHOL/HDL Ratio: 3
Triglycerides: 63 mg/dL (ref 0.0–149.0)
VLDL: 12.6 mg/dL (ref 0.0–40.0)

## 2022-06-10 LAB — HEMOGLOBIN A1C: Hgb A1c MFr Bld: 5 % (ref 4.6–6.5)

## 2022-06-10 MED ORDER — NIRMATRELVIR/RITONAVIR (PAXLOVID)TABLET: 3.0000 | ORAL_TABLET | Freq: Two times a day (BID) | ORAL | 0 refills | Status: AC

## 2022-06-17 ENCOUNTER — Other Ambulatory Visit: Payer: Self-pay | Admitting: Internal Medicine

## 2022-07-13 ENCOUNTER — Encounter: Payer: Self-pay | Admitting: Internal Medicine

## 2022-07-13 NOTE — Patient Instructions (Addendum)
Medications changes include :   none    Return in about 1 year (around 07/15/2023) for Physical Exam.    Health Maintenance, Female Adopting a healthy lifestyle and getting preventive care are important in promoting health and wellness. Ask your health care provider about: The right schedule for you to have regular tests and exams. Things you can do on your own to prevent diseases and keep yourself healthy. What should I know about diet, weight, and exercise? Eat a healthy diet  Eat a diet that includes plenty of vegetables, fruits, low-fat dairy products, and lean protein. Do not eat a lot of foods that are high in solid fats, added sugars, or sodium. Maintain a healthy weight Body mass index (BMI) is used to identify weight problems. It estimates body fat based on height and weight. Your health care provider can help determine your BMI and help you achieve or maintain a healthy weight. Get regular exercise Get regular exercise. This is one of the most important things you can do for your health. Most adults should: Exercise for at least 150 minutes each week. The exercise should increase your heart rate and make you sweat (moderate-intensity exercise). Do strengthening exercises at least twice a week. This is in addition to the moderate-intensity exercise. Spend less time sitting. Even light physical activity can be beneficial. Watch cholesterol and blood lipids Have your blood tested for lipids and cholesterol at 60 years of age, then have this test every 5 years. Have your cholesterol levels checked more often if: Your lipid or cholesterol levels are high. You are older than 60 years of age. You are at high risk for heart disease. What should I know about cancer screening? Depending on your health history and family history, you may need to have cancer screening at various ages. This may include screening for: Breast cancer. Cervical cancer. Colorectal cancer. Skin  cancer. Lung cancer. What should I know about heart disease, diabetes, and high blood pressure? Blood pressure and heart disease High blood pressure causes heart disease and increases the risk of stroke. This is more likely to develop in people who have high blood pressure readings or are overweight. Have your blood pressure checked: Every 3-5 years if you are 4-51 years of age. Every year if you are 21 years old or older. Diabetes Have regular diabetes screenings. This checks your fasting blood sugar level. Have the screening done: Once every three years after age 24 if you are at a normal weight and have a low risk for diabetes. More often and at a younger age if you are overweight or have a high risk for diabetes. What should I know about preventing infection? Hepatitis B If you have a higher risk for hepatitis B, you should be screened for this virus. Talk with your health care provider to find out if you are at risk for hepatitis B infection. Hepatitis C Testing is recommended for: Everyone born from [birth year] through 1965. Anyone with known risk factors for hepatitis C. Sexually transmitted infections (STIs) Get screened for STIs, including gonorrhea and chlamydia, if: You are sexually active and are younger than 60 years of age. You are older than 60 years of age and your health care provider tells you that you are at risk for this type of infection. Your sexual activity has changed since you were last screened, and you are at increased risk for chlamydia or gonorrhea. Ask your health care provider if you are at risk. Ask  your health care provider about whether you are at high risk for HIV. Your health care provider may recommend a prescription medicine to help prevent HIV infection. If you choose to take medicine to prevent HIV, you should first get tested for HIV. You should then be tested every 3 months for as long as you are taking the medicine. Pregnancy If you are about to stop  having your period (premenopausal) and you may become pregnant, seek counseling before you get pregnant. Take 400 to 800 micrograms (mcg) of folic acid every day if you become pregnant. Ask for birth control (contraception) if you want to prevent pregnancy. Osteoporosis and menopause Osteoporosis is a disease in which the bones lose minerals and strength with aging. This can result in bone fractures. If you are 69 years old or older, or if you are at risk for osteoporosis and fractures, ask your health care provider if you should: Be screened for bone loss. Take a calcium or vitamin D supplement to lower your risk of fractures. Be given hormone replacement therapy (HRT) to treat symptoms of menopause. Follow these instructions at home: Alcohol use Do not drink alcohol if: Your health care provider tells you not to drink. You are pregnant, may be pregnant, or are planning to become pregnant. If you drink alcohol: Limit how much you have to: 0-1 drink a day. Know how much alcohol is in your drink. In the [place] one drink equals one 12 oz bottle of beer (355 mL), one 5 oz glass of wine (148 mL), or one 1 oz glass of hard liquor (44 mL). Lifestyle Do not use any products that contain nicotine or tobacco. These products include cigarettes, chewing tobacco, and vaping devices, such as e-cigarettes. If you need help quitting, ask your health care provider. Do not use street drugs. Do not share needles. Ask your health care provider for help if you need support or information about quitting drugs. General instructions Schedule regular health, dental, and eye exams. Stay current with your vaccines. Tell your health care provider if: You often feel depressed. You have ever been abused or do not feel safe at home. Summary Adopting a healthy lifestyle and getting preventive care are important in promoting health and wellness. Follow your health care provider's instructions about healthy diet,  exercising, and getting tested or screened for diseases. Follow your health care provider's instructions on monitoring your cholesterol and blood pressure. This information is not intended to replace advice given to you by your health care provider. Make sure you discuss any questions you have with your health care provider. Document Revised: 02/02/2021 Document Reviewed: 02/02/2021 Elsevier Patient Education  [employer].

## 2022-07-13 NOTE — Progress Notes (Unsigned)
Subjective:    Patient ID: Caroline Wall, female    DOB: 1961/06/02, 61 y.o.   MRN: [id number]      HPI Caroline Wall is here for a Physical exam.   Has been eating better.  Has lost weight.  Exercising regularly.     Medications and allergies reviewed with patient and updated if appropriate.  Current Outpatient Medications on File Prior to Visit  Medication Sig Dispense Refill   Ascorbic Acid (VITAMIN C) 1000 MG tablet Take 1,000 mg by mouth daily.     Cholecalciferol (VITAMIN D) 10 MCG/ML LIQD      hydrochlorothiazide (HYDRODIURIL) 25 MG tablet TAKE 1 TABLET DAILY 90 tablet 3   hydroxychloroquine (PLAQUENIL) 200 MG tablet Take 200 mg by mouth every other day.     levothyroxine (SYNTHROID) 75 MCG tablet TAKE 1 TABLET DAILY 90 tablet 3   Multiple Vitamin (MULTIVITAMIN) tablet Take 1 tablet by mouth daily.     Multiple Vitamins-Minerals (ONE DAILY CALCIUM/IRON) TABS Take by mouth.     Omega-3 Fatty Acids (FISH OIL) 1000 MG CAPS Take by mouth daily.     Turmeric 500 MG CAPS Take by mouth.     No current facility-administered medications on file prior to visit.    Review of Systems  Constitutional:  Negative for fever.  Eyes:  Negative for visual disturbance.  Respiratory:  Negative for cough, shortness of breath and wheezing.   Cardiovascular:  Negative for chest pain, palpitations and leg swelling.  Gastrointestinal:  Positive for constipation. Negative for abdominal pain, blood in stool, diarrhea and nausea.       No gerd  Genitourinary:  Negative for dysuria.  Musculoskeletal:  Positive for arthralgias (mild hand, left knee). Negative for back pain.  Skin:  Negative for rash.  Neurological:  Negative for light-headedness and headaches.  Psychiatric/Behavioral:  Negative for dysphoric mood. The patient is not nervous/anxious.        Objective:   Vitals:   07/14/22 0838  BP: 120/80  Pulse: (!) 52  Temp: 98.2 F (36.8 C)  SpO2: 97%   Filed Weights   07/14/22 0838   Weight: 196 lb (88.9 kg)   Body mass index is 29.37 kg/m.  BP Readings from Last 3 Encounters:  07/14/22 120/80  07/09/21 130/76  06/18/20 134/82    Wt Readings from Last 3 Encounters:  07/14/22 196 lb (88.9 kg)  07/09/21 210 lb (95.3 kg)  06/18/20 215 lb 3.2 oz (97.6 kg)       Physical Exam Constitutional: She appears well-developed and well-nourished. No distress.  HENT:  Head: Normocephalic and atraumatic.  Right Ear: External ear normal. Normal ear canal and TM Left Ear: External ear normal.  Normal ear canal and TM Mouth/Throat: Oropharynx is clear and moist.  Eyes: Conjunctivae normal.  Neck: Neck supple. No tracheal deviation present. No thyromegaly present.  No carotid bruit  Cardiovascular: Normal rate, regular rhythm and normal heart sounds.   No murmur heard.  No edema. Pulmonary/Chest: Effort normal and breath sounds normal. No respiratory distress. She has no wheezes. She has no rales.  Breast: deferred   Abdominal: Soft. She exhibits no distension. There is no tenderness.  Lymphadenopathy: She has no cervical adenopathy.  Skin: Skin is warm and dry. She is not diaphoretic.  Psychiatric: She has a normal mood and affect. Her behavior is normal.     Lab Results  Component Value Date   WBC 4.1 06/10/2022   HGB 14.3 06/10/2022  HCT 41.1 06/10/2022   PLT 180.0 06/10/2022   GLUCOSE 95 06/10/2022   CHOL 185 06/10/2022   TRIG 63.0 06/10/2022   HDL 73.60 06/10/2022   LDLDIRECT 145.0 02/26/2019   LDLCALC 99 06/10/2022   ALT 20 06/10/2022   AST 19 06/10/2022   NA 135 06/10/2022   K 4.1 06/10/2022   CL 97 06/10/2022   CREATININE 0.77 06/10/2022   BUN 24 (H) 06/10/2022   CO2 30 06/10/2022   TSH 2.30 06/10/2022   HGBA1C 5.0 06/10/2022         Assessment & Plan:   Physical exam: Screening blood work  reviewed Exercise  exercising 3-4 times a week, walking dog Weight  encouraged weight loss Substance abuse  none   Reviewed recommended  immunizations.  Flu vaccine today.   Health Maintenance  Topic Date Due   PAP SMEAR-Modifier  12/27/2018   INFLUENZA VACCINE  04/27/2022   COVID-19 Vaccine (5 - Pfizer risk series) 07/30/2022 (Originally 04/05/2021)   MAMMOGRAM  04/01/2024   COLONOSCOPY (Pts 45-37yr Insurance coverage will need to be confirmed)  09/15/2025   TETANUS/TDAP  02/15/2026   Hepatitis C Screening  Completed   HIV Screening  Completed   Zoster Vaccines- Shingrix  Completed   HPV VACCINES  Aged Out          See Problem List for Assessment and Plan of chronic medical problems.

## 2022-07-14 ENCOUNTER — Ambulatory Visit (INDEPENDENT_AMBULATORY_CARE_PROVIDER_SITE_OTHER): Payer: 59 | Admitting: Internal Medicine

## 2022-07-14 VITALS — BP 120/80 | HR 52 | Temp 98.2°F | Ht 68.5 in | Wt 196.0 lb

## 2022-07-14 DIAGNOSIS — I10 Essential (primary) hypertension: Secondary | ICD-10-CM

## 2022-07-14 DIAGNOSIS — E038 Other specified hypothyroidism: Secondary | ICD-10-CM | POA: Diagnosis not present

## 2022-07-14 DIAGNOSIS — E782 Mixed hyperlipidemia: Secondary | ICD-10-CM | POA: Diagnosis not present

## 2022-07-14 DIAGNOSIS — E063 Autoimmune thyroiditis: Secondary | ICD-10-CM

## 2022-07-14 DIAGNOSIS — Z0000 Encounter for general adult medical examination without abnormal findings: Secondary | ICD-10-CM

## 2022-07-14 DIAGNOSIS — Z833 Family history of diabetes mellitus: Secondary | ICD-10-CM

## 2022-07-14 DIAGNOSIS — K5900 Constipation, unspecified: Secondary | ICD-10-CM

## 2022-07-14 DIAGNOSIS — Z23 Encounter for immunization: Secondary | ICD-10-CM

## 2022-07-14 NOTE — Assessment & Plan Note (Signed)
Taking colace every other day Taking a probiotic daily Controlled

## 2022-07-14 NOTE — Assessment & Plan Note (Signed)
Family history of diabetes A1c 5.0% Continue low sugar/carb diet, regular exercise

## 2022-07-14 NOTE — Assessment & Plan Note (Signed)
Chronic  Clinically euthyroid TSH in normal range Continue levothyroxine 75 mcg daily

## 2022-07-14 NOTE — Assessment & Plan Note (Signed)
Chronic Blood pressure well controlled CMP reviewed-GFR, potassium normal Continue HCTZ 25 mg daily

## 2022-07-14 NOTE — Assessment & Plan Note (Addendum)
Chronic LDL 99, which is good much improved Continue lifestyle control Continue healthy diet, regular exercise

## 2022-08-31 ENCOUNTER — Encounter: Payer: Self-pay | Admitting: Internal Medicine

## 2022-11-18 ENCOUNTER — Other Ambulatory Visit: Payer: Self-pay | Admitting: Obstetrics & Gynecology

## 2022-11-18 DIAGNOSIS — E2839 Other primary ovarian failure: Secondary | ICD-10-CM

## 2022-12-30 ENCOUNTER — Other Ambulatory Visit: Payer: 59

## 2023-02-16 ENCOUNTER — Other Ambulatory Visit: Payer: Self-pay | Admitting: Internal Medicine

## 2023-02-16 DIAGNOSIS — Z1231 Encounter for screening mammogram for malignant neoplasm of breast: Secondary | ICD-10-CM

## 2023-06-09 ENCOUNTER — Ambulatory Visit
Admission: RE | Admit: 2023-06-09 | Discharge: 2023-06-09 | Disposition: A | Discharge status: Home / Self Care | Payer: 59 | Source: Ambulatory Visit | Attending: Internal Medicine | Admitting: Internal Medicine

## 2023-06-09 ENCOUNTER — Ambulatory Visit
Admission: RE | Admit: 2023-06-09 | Discharge: 2023-06-09 | Disposition: A | Discharge status: Home / Self Care | Payer: 59 | Source: Ambulatory Visit | Attending: Obstetrics & Gynecology | Admitting: Obstetrics & Gynecology

## 2023-06-09 DIAGNOSIS — E2839 Other primary ovarian failure: Secondary | ICD-10-CM

## 2023-06-09 DIAGNOSIS — Z1231 Encounter for screening mammogram for malignant neoplasm of breast: Secondary | ICD-10-CM

## 2023-06-13 ENCOUNTER — Other Ambulatory Visit: Payer: Self-pay | Admitting: Internal Medicine

## 2023-06-13 ENCOUNTER — Encounter: Payer: Self-pay | Admitting: Internal Medicine

## 2023-06-13 DIAGNOSIS — R928 Other abnormal and inconclusive findings on diagnostic imaging of breast: Secondary | ICD-10-CM

## 2023-06-17 ENCOUNTER — Ambulatory Visit
Admission: RE | Admit: 2023-06-17 | Discharge: 2023-06-17 | Disposition: A | Discharge status: Home / Self Care | Payer: 59 | Source: Ambulatory Visit | Attending: Internal Medicine

## 2023-06-17 DIAGNOSIS — R928 Other abnormal and inconclusive findings on diagnostic imaging of breast: Secondary | ICD-10-CM

## 2023-07-05 ENCOUNTER — Encounter: Payer: Self-pay | Admitting: Internal Medicine

## 2023-07-07 ENCOUNTER — Other Ambulatory Visit: Payer: Self-pay

## 2023-07-07 DIAGNOSIS — Z833 Family history of diabetes mellitus: Secondary | ICD-10-CM

## 2023-07-07 DIAGNOSIS — Z0000 Encounter for general adult medical examination without abnormal findings: Secondary | ICD-10-CM

## 2023-07-07 DIAGNOSIS — E782 Mixed hyperlipidemia: Secondary | ICD-10-CM

## 2023-07-07 DIAGNOSIS — E063 Autoimmune thyroiditis: Secondary | ICD-10-CM

## 2023-07-07 DIAGNOSIS — I10 Essential (primary) hypertension: Secondary | ICD-10-CM

## 2023-07-08 ENCOUNTER — Other Ambulatory Visit (INDEPENDENT_AMBULATORY_CARE_PROVIDER_SITE_OTHER): Payer: 59

## 2023-07-08 DIAGNOSIS — I10 Essential (primary) hypertension: Secondary | ICD-10-CM

## 2023-07-08 DIAGNOSIS — E782 Mixed hyperlipidemia: Secondary | ICD-10-CM

## 2023-07-08 DIAGNOSIS — E063 Autoimmune thyroiditis: Secondary | ICD-10-CM

## 2023-07-08 DIAGNOSIS — Z833 Family history of diabetes mellitus: Secondary | ICD-10-CM

## 2023-07-08 DIAGNOSIS — Z0000 Encounter for general adult medical examination without abnormal findings: Secondary | ICD-10-CM | POA: Diagnosis not present

## 2023-07-08 LAB — CBC WITH DIFFERENTIAL/PLATELET
Basophils Absolute: 0 10*3/uL (ref 0.0–0.1)
Basophils Relative: 0.8 % (ref 0.0–3.0)
Eosinophils Absolute: 0.1 10*3/uL (ref 0.0–0.7)
Eosinophils Relative: 2.2 % (ref 0.0–5.0)
HCT: 42.5 % (ref 36.0–46.0)
Hemoglobin: 14.4 g/dL (ref 12.0–15.0)
Lymphocytes Relative: 30.3 % (ref 12.0–46.0)
Lymphs Abs: 1.2 10*3/uL (ref 0.7–4.0)
MCHC: 34 g/dL (ref 30.0–36.0)
MCV: 91.3 fL (ref 78.0–100.0)
Monocytes Absolute: 0.4 10*3/uL (ref 0.1–1.0)
Monocytes Relative: 9.8 % (ref 3.0–12.0)
Neutro Abs: 2.3 10*3/uL (ref 1.4–7.7)
Neutrophils Relative %: 56.9 % (ref 43.0–77.0)
Platelets: 193 10*3/uL (ref 150.0–400.0)
RBC: 4.65 Mil/uL (ref 3.87–5.11)
RDW: 13.5 % (ref 11.5–15.5)
WBC: 4 10*3/uL (ref 4.0–10.5)

## 2023-07-08 LAB — COMPREHENSIVE METABOLIC PANEL
ALT: 14 U/L (ref 0–35)
AST: 17 U/L (ref 0–37)
Albumin: 4.4 g/dL (ref 3.5–5.2)
Alkaline Phosphatase: 36 U/L — ABNORMAL LOW (ref 39–117)
BUN: 18 mg/dL (ref 6–23)
CO2: 30 meq/L (ref 19–32)
Calcium: 9.8 mg/dL (ref 8.4–10.5)
Chloride: 101 meq/L (ref 96–112)
Creatinine, Ser: 0.77 mg/dL (ref 0.40–1.20)
GFR: 83.44 mL/min (ref >60.00)
Glucose, Bld: 103 mg/dL — ABNORMAL HIGH (ref 70–99)
Potassium: 3.8 meq/L (ref 3.5–5.1)
Sodium: 139 meq/L (ref 135–145)
Total Bilirubin: 0.7 mg/dL (ref 0.2–1.2)
Total Protein: 6.3 g/dL (ref 6.0–8.3)

## 2023-07-08 LAB — LIPID PANEL
Cholesterol: 238 mg/dL — ABNORMAL HIGH (ref 0–200)
HDL: 63.3 mg/dL (ref >39.00)
LDL Cholesterol: 156 mg/dL — ABNORMAL HIGH (ref 0–99)
NonHDL: 174.81
Total CHOL/HDL Ratio: 4
Triglycerides: 94 mg/dL (ref 0.0–149.0)
VLDL: 18.8 mg/dL (ref 0.0–40.0)

## 2023-07-08 LAB — HEMOGLOBIN A1C: Hgb A1c MFr Bld: 5 % (ref 4.6–6.5)

## 2023-07-08 LAB — TSH: TSH: 3.46 u[IU]/mL (ref 0.35–5.50)

## 2023-07-14 ENCOUNTER — Encounter: Payer: Self-pay | Admitting: Internal Medicine

## 2023-07-14 NOTE — Patient Instructions (Addendum)
Flu immunization administered today.     Have blood work done again in 3-6 months   Medications changes include :  increase levothyroxine 88 mcg     Return in about 1 year (around 07/14/2024) for Physical Exam.   Health Maintenance, Female Adopting a healthy lifestyle and getting preventive care are important in promoting health and wellness. Ask your health care provider about: The right schedule for you to have regular tests and exams. Things you can do on your own to prevent diseases and keep yourself healthy. What should I know about diet, weight, and exercise? Eat a healthy diet  Eat a diet that includes plenty of vegetables, fruits, low-fat dairy products, and lean protein. Do not eat a lot of foods that are high in solid fats, added sugars, or sodium. Maintain a healthy weight Body mass index (BMI) is used to identify weight problems. It estimates body fat based on height and weight. Your health care provider can help determine your BMI and help you achieve or maintain a healthy weight. Get regular exercise Get regular exercise. This is one of the most important things you can do for your health. Most adults should: Exercise for at least 150 minutes each week. The exercise should increase your heart rate and make you sweat (moderate-intensity exercise). Do strengthening exercises at least twice a week. This is in addition to the moderate-intensity exercise. Spend less time sitting. Even light physical activity can be beneficial. Watch cholesterol and blood lipids Have your blood tested for lipids and cholesterol at 61 years of age, then have this test every 5 years. Have your cholesterol levels checked more often if: Your lipid or cholesterol levels are high. You are older than 61 years of age. You are at high risk for heart disease. What should I know about cancer screening? Depending on your health history and family history, you may need to have cancer screening at  various ages. This may include screening for: Breast cancer. Cervical cancer. Colorectal cancer. Skin cancer. Lung cancer. What should I know about heart disease, diabetes, and high blood pressure? Blood pressure and heart disease High blood pressure causes heart disease and increases the risk of stroke. This is more likely to develop in people who have high blood pressure readings or are overweight. Have your blood pressure checked: Every 3-5 years if you are 26-49 years of age. Every year if you are 35 years old or older. Diabetes Have regular diabetes screenings. This checks your fasting blood sugar level. Have the screening done: Once every three years after age 28 if you are at a normal weight and have a low risk for diabetes. More often and at a younger age if you are overweight or have a high risk for diabetes. What should I know about preventing infection? Hepatitis B If you have a higher risk for hepatitis B, you should be screened for this virus. Talk with your health care provider to find out if you are at risk for hepatitis B infection. Hepatitis C Testing is recommended for: Everyone born from [birth year] through 1965. Anyone with known risk factors for hepatitis C. Sexually transmitted infections (STIs) Get screened for STIs, including gonorrhea and chlamydia, if: You are sexually active and are younger than 61 years of age. You are older than 61 years of age and your health care provider tells you that you are at risk for this type of infection. Your sexual activity has changed since you were last screened, and you  are at increased risk for chlamydia or gonorrhea. Ask your health care provider if you are at risk. Ask your health care provider about whether you are at high risk for HIV. Your health care provider may recommend a prescription medicine to help prevent HIV infection. If you choose to take medicine to prevent HIV, you should first get tested for HIV. You should then be  tested every 3 months for as long as you are taking the medicine. Pregnancy If you are about to stop having your period (premenopausal) and you may become pregnant, seek counseling before you get pregnant. Take 400 to 800 micrograms (mcg) of folic acid every day if you become pregnant. Ask for birth control (contraception) if you want to prevent pregnancy. Osteoporosis and menopause Osteoporosis is a disease in which the bones lose minerals and strength with aging. This can result in bone fractures. If you are 57 years old or older, or if you are at risk for osteoporosis and fractures, ask your health care provider if you should: Be screened for bone loss. Take a calcium or vitamin D supplement to lower your risk of fractures. Be given hormone replacement therapy (HRT) to treat symptoms of menopause. Follow these instructions at home: Alcohol use Do not drink alcohol if: Your health care provider tells you not to drink. You are pregnant, may be pregnant, or are planning to become pregnant. If you drink alcohol: Limit how much you have to: 0-1 drink a day. Know how much alcohol is in your drink. In the [place] one drink equals one 12 oz bottle of beer (355 mL), one 5 oz glass of wine (148 mL), or one 1 oz glass of hard liquor (44 mL). Lifestyle Do not use any products that contain nicotine or tobacco. These products include cigarettes, chewing tobacco, and vaping devices, such as e-cigarettes. If you need help quitting, ask your health care provider. Do not use street drugs. Do not share needles. Ask your health care provider for help if you need support or information about quitting drugs. General instructions Schedule regular health, dental, and eye exams. Stay current with your vaccines. Tell your health care provider if: You often feel depressed. You have ever been abused or do not feel safe at home. Summary Adopting a healthy lifestyle and getting preventive care are important in  promoting health and wellness. Follow your health care provider's instructions about healthy diet, exercising, and getting tested or screened for diseases. Follow your health care provider's instructions on monitoring your cholesterol and blood pressure. This information is not intended to replace advice given to you by your health care provider. Make sure you discuss any questions you have with your health care provider. Document Revised: 02/02/2021 Document Reviewed: 02/02/2021 Elsevier Patient Education  2024 [employer].

## 2023-07-14 NOTE — Progress Notes (Signed)
Subjective:    Patient ID: Caroline Wall, female    DOB: 1960-12-10, 62 y.o.   MRN: [id number]      HPI Caroline Wall is here for a Physical exam and her chronic medical problems.    Doing well. No concerns   Doing some intermittent fasting.   Medications and allergies reviewed with patient and updated if appropriate.  Current Outpatient Medications on File Prior to Visit  Medication Sig Dispense Refill   Ascorbic Acid (VITAMIN C) 1000 MG tablet Take 1,000 mg by mouth daily.     Cholecalciferol (VITAMIN D) 10 MCG/ML LIQD      hydrochlorothiazide (HYDRODIURIL) 25 MG tablet TAKE 1 TABLET DAILY 90 tablet 3   hydroxychloroquine (PLAQUENIL) 200 MG tablet Take 200 mg by mouth every other day.     levothyroxine (SYNTHROID) 75 MCG tablet TAKE 1 TABLET DAILY 90 tablet 3   Multiple Vitamin (MULTIVITAMIN) tablet Take 1 tablet by mouth daily.     Multiple Vitamins-Minerals (ONE DAILY CALCIUM/IRON) TABS Take by mouth.     Omega-3 Fatty Acids (FISH OIL) 1000 MG CAPS Take by mouth daily.     Turmeric 500 MG CAPS Take by mouth.     No current facility-administered medications on file prior to visit.    Review of Systems  Constitutional:  Negative for fever.  Eyes:  Negative for visual disturbance.  Respiratory:  Negative for cough, shortness of breath and wheezing.   Cardiovascular:  Negative for chest pain, palpitations and leg swelling.  Gastrointestinal:  Positive for constipation (colace qod - controlled). Negative for abdominal pain, blood in stool and diarrhea.       No gerd  Genitourinary:  Negative for dysuria.  Musculoskeletal:  Positive for arthralgias (right shoulder). Negative for back pain.  Skin:  Negative for rash.  Neurological:  Negative for light-headedness and headaches.  Psychiatric/Behavioral:  Negative for dysphoric mood. The patient is not nervous/anxious.        Objective:   Vitals:   07/15/23 0931  BP: 128/78  Pulse: (!) 50  Temp: 98 F (36.7 C)  SpO2:  98%   Filed Weights   07/15/23 0931  Weight: 198 lb (89.8 kg)   Body mass index is 29.67 kg/m.  BP Readings from Last 3 Encounters:  07/15/23 128/78  07/14/22 120/80  07/09/21 130/76    Wt Readings from Last 3 Encounters:  07/15/23 198 lb (89.8 kg)  07/14/22 196 lb (88.9 kg)  07/09/21 210 lb (95.3 kg)       Physical Exam Constitutional: She appears well-developed and well-nourished. No distress.  HENT:  Head: Normocephalic and atraumatic.  Right Ear: External ear normal. Normal ear canal and TM Left Ear: External ear normal.  Normal ear canal and TM Mouth/Throat: Oropharynx is clear and moist.  Eyes: Conjunctivae normal.  Neck: Neck supple. No tracheal deviation present. No thyromegaly present.  No carotid bruit  Cardiovascular: Normal rate, regular rhythm and normal heart sounds.   No murmur heard.  No edema. Pulmonary/Chest: Effort normal and breath sounds normal. No respiratory distress. She has no wheezes. She has no rales.  Breast: deferred   Abdominal: Soft. She exhibits no distension. There is no tenderness.  Lymphadenopathy: She has no cervical adenopathy.  Skin: Skin is warm and dry. She is not diaphoretic.  Psychiatric: She has a normal mood and affect. Her behavior is normal.     Lab Results  Component Value Date   WBC 4.0 07/08/2023   HGB 14.4 07/08/2023  HCT 42.5 07/08/2023   PLT 193.0 07/08/2023   GLUCOSE 103 (H) 07/08/2023   CHOL 238 (H) 07/08/2023   TRIG 94.0 07/08/2023   HDL 63.30 07/08/2023   LDLDIRECT 145.0 02/26/2019   LDLCALC 156 (H) 07/08/2023   ALT 14 07/08/2023   AST 17 07/08/2023   NA 139 07/08/2023   K 3.8 07/08/2023   CL 101 07/08/2023   CREATININE 0.77 07/08/2023   BUN 18 07/08/2023   CO2 30 07/08/2023   TSH 3.46 07/08/2023   HGBA1C 5.0 07/08/2023    The 10-year ASCVD risk score (Arnett DK, et al., 2019) is: 5.1%   Values used to calculate the score:     Age: 30 years     Sex: Female     Is Non-Hispanic African  American: No     Diabetic: No     Tobacco smoker: No     Systolic Blood Pressure: 128 mmHg     Is BP treated: Yes     HDL Cholesterol: 63.3 mg/dL     Total Cholesterol: 238 mg/dL      Assessment & Plan:   Physical exam: Screening blood work  reviewed Exercise  hiking, walking, exercise classes 4/week - [facility] Weight  overweight Substance abuse  none   Reviewed recommended immunizations.  Flu immunization administered today.     Health Maintenance  Topic Date Due   Cervical Cancer Screening (HPV/Pap Cotest)  12/26/2016   INFLUENZA VACCINE  04/28/2023   COVID-19 Vaccine (5 - 2023-24 season) 05/29/2023   MAMMOGRAM  06/08/2025   Colonoscopy  09/15/2025   DTaP/Tdap/Td (3 - Td or Tdap) 02/15/2026   Hepatitis C Screening  Completed   HIV Screening  Completed   Zoster Vaccines- Shingrix  Completed   HPV VACCINES  Aged Out          See Problem List for Assessment and Plan of chronic medical problems.   Will recheck labs in 3-6 months  - she will work in diet, weight and continue regular exercise

## 2023-07-15 ENCOUNTER — Ambulatory Visit (INDEPENDENT_AMBULATORY_CARE_PROVIDER_SITE_OTHER): Payer: 59 | Admitting: Internal Medicine

## 2023-07-15 VITALS — BP 128/78 | HR 50 | Temp 98.0°F | Ht 68.5 in | Wt 198.0 lb

## 2023-07-15 DIAGNOSIS — I10 Essential (primary) hypertension: Secondary | ICD-10-CM

## 2023-07-15 DIAGNOSIS — E063 Autoimmune thyroiditis: Secondary | ICD-10-CM | POA: Diagnosis not present

## 2023-07-15 DIAGNOSIS — E782 Mixed hyperlipidemia: Secondary | ICD-10-CM

## 2023-07-15 DIAGNOSIS — Z0000 Encounter for general adult medical examination without abnormal findings: Secondary | ICD-10-CM

## 2023-07-15 DIAGNOSIS — Z23 Encounter for immunization: Secondary | ICD-10-CM | POA: Diagnosis not present

## 2023-07-15 MED ORDER — LEVOTHYROXINE SODIUM 88 MCG PO TABS: 88.0000 ug | ORAL_TABLET | Freq: Every day | ORAL | 3 refills | Status: DC

## 2023-07-15 NOTE — Assessment & Plan Note (Addendum)
Chronic Lab Results  Component Value Date   LDLCALC 156 (H) 07/08/2023   LDL higher than ideal - will continue to work on diet Ascvd risk low Continue lifestyle control Continue healthy diet, regular exercise

## 2023-07-15 NOTE — Assessment & Plan Note (Addendum)
Chronic  Clinically euthyroid Tsh in normal range but higher end increase levothyroxine to 88 mcg daily

## 2023-07-15 NOTE — Assessment & Plan Note (Addendum)
Chronic Blood pressure well controlled CMP, cbc reviewed Continue HCTZ 25 mg daily

## 2023-09-29 ENCOUNTER — Other Ambulatory Visit: Payer: Self-pay

## 2023-09-29 ENCOUNTER — Encounter: Payer: Self-pay | Admitting: Internal Medicine

## 2023-09-29 MED ORDER — LEVOTHYROXINE SODIUM 88 MCG PO TABS: 88.0000 ug | ORAL_TABLET | Freq: Every day | ORAL | 3 refills | Status: DC

## 2023-10-21 ENCOUNTER — Encounter: Payer: Self-pay | Admitting: Internal Medicine

## 2023-10-24 ENCOUNTER — Ambulatory Visit (INDEPENDENT_AMBULATORY_CARE_PROVIDER_SITE_OTHER): Payer: 59 | Admitting: Internal Medicine

## 2023-10-24 VITALS — BP 118/80 | HR 50 | Temp 98.3°F | Ht 68.5 in | Wt 204.0 lb

## 2023-10-24 DIAGNOSIS — R001 Bradycardia, unspecified: Secondary | ICD-10-CM | POA: Diagnosis not present

## 2023-10-24 DIAGNOSIS — R310 Gross hematuria: Secondary | ICD-10-CM | POA: Diagnosis not present

## 2023-10-24 DIAGNOSIS — R300 Dysuria: Secondary | ICD-10-CM | POA: Diagnosis not present

## 2023-10-24 LAB — POC URINALSYSI DIPSTICK (AUTOMATED)
Bilirubin, UA: NEGATIVE
Blood, UA: NEGATIVE
Glucose, UA: NEGATIVE
Ketones, UA: NEGATIVE
Leukocytes, UA: NEGATIVE
Nitrite, UA: NEGATIVE
Protein, UA: NEGATIVE
Spec Grav, UA: 1.01 (ref 1.010–1.025)
Urobilinogen, UA: 0.2 U/dL
pH, UA: 7 (ref 5.0–8.0)

## 2023-10-24 NOTE — Assessment & Plan Note (Signed)
Acute ?  UTI UA negative for obvious UTI-urine sent for culture Will hold off on antibiotics unless culture is positive Continue increased fluids Has upcoming GYN appointment in the next month or 2

## 2023-10-24 NOTE — Patient Instructions (Addendum)
    We will send your urine for a culture.       Medications changes include :   None    A CT scan of your kidneys was ordered and someone will call you to schedule an appointment.     Return if symptoms worsen or fail to improve.

## 2023-10-24 NOTE — Progress Notes (Signed)
    Subjective:    Patient ID: Caroline Wall, female    DOB: January 13, 1961, 63 y.o.   MRN: [id number]      HPI Monserrat is here for  Chief Complaint  Patient presents with   Urinary Tract Infection    Pressure, blood in urine noted; frequency since yesterday    ? UTI:  Her symptoms started 1 day ago.  She states dysuria, pressure in bladder, blood when she wiped. She thought she may have passed something but did not see anything.  No hx stones  did not have any back pain  She denies urinary frequency, urinary urgency, hematuria, abdominal pain, back pain, nausea, fever, no vulvar itch or vaginal discharge  No h/o utis.  Has had yeast infection.   Has had HR at night in 40's- no symptoms   Medications and allergies reviewed with patient and updated if appropriate.  Current Outpatient Medications on File Prior to Visit  Medication Sig Dispense Refill   Ascorbic Acid (VITAMIN C) 1000 MG tablet Take 1,000 mg by mouth daily.     Cholecalciferol (VITAMIN D) 10 MCG/ML LIQD      fluorouracil (EFUDEX) 5 % cream Apply 1 Application topically 2 (two) times daily.     hydrochlorothiazide (HYDRODIURIL) 25 MG tablet TAKE 1 TABLET DAILY 90 tablet 3   levothyroxine (SYNTHROID) 88 MCG tablet Take 1 tablet (88 mcg total) by mouth daily. 90 tablet 3   Multiple Vitamin (MULTIVITAMIN) tablet Take 1 tablet by mouth daily.     Multiple Vitamins-Minerals (ONE DAILY CALCIUM/IRON) TABS Take by mouth.     Omega-3 Fatty Acids (FISH OIL) 1000 MG CAPS Take by mouth daily.     Turmeric 500 MG CAPS Take by mouth.     No current facility-administered medications on file prior to visit.    Review of Systems  Constitutional:  Negative for fever.  Gastrointestinal:  Negative for abdominal pain and nausea.  Genitourinary:  Positive for dysuria and hematuria (with wiping). Negative for vaginal bleeding and vaginal discharge.  Musculoskeletal:  Negative for back pain.       Objective:   Vitals:   10/24/23  1559  BP: 118/80  Pulse: (!) 50  Temp: 98.3 F (36.8 C)  SpO2: 97%   BP Readings from Last 3 Encounters:  10/24/23 118/80  07/15/23 128/78  07/14/22 120/80   Wt Readings from Last 3 Encounters:  10/24/23 204 lb (92.5 kg)  07/15/23 198 lb (89.8 kg)  07/14/22 196 lb (88.9 kg)   Body mass index is 30.57 kg/m.    Physical Exam Constitutional:      General: She is not in acute distress.    Appearance: Normal appearance. She is not ill-appearing.  HENT:     Head: Normocephalic.  Eyes:     Conjunctiva/sclera: Conjunctivae normal.  Abdominal:     General: There is no distension.     Palpations: Abdomen is soft.     Tenderness: There is no abdominal tenderness. There is no right CVA tenderness, left CVA tenderness, guarding or rebound.  Skin:    General: Skin is warm and dry.  Neurological:     Mental Status: She is alert.            Assessment & Plan:    See Problem List for Assessment and Plan of chronic medical problems.

## 2023-10-24 NOTE — Assessment & Plan Note (Signed)
Has noticed at night her HR is in the 40's - she is asymptomatic HR during day in 50-60's Reassured - this is normal and since she is not symptomatic no further evaluation is needed She will monitor

## 2023-10-24 NOTE — Assessment & Plan Note (Addendum)
Acute For the past day - has had blood when wiping after urinating No obvious uti with urinalysis-will send for culture but hold antibiotics unless culture is positive ?  Stone She had a sensation that she may have passed something but she did not see anything, no history of stones Will get renal CT ?  Vaginal atrophy-has upcoming appointment with gynecologist in the next month or 2

## 2023-10-25 ENCOUNTER — Ambulatory Visit (HOSPITAL_BASED_OUTPATIENT_CLINIC_OR_DEPARTMENT_OTHER)
Admission: RE | Admit: 2023-10-25 | Discharge: 2023-10-25 | Disposition: A | Discharge status: Home / Self Care | Payer: 59 | Source: Ambulatory Visit | Attending: Internal Medicine | Admitting: Internal Medicine

## 2023-10-25 DIAGNOSIS — R310 Gross hematuria: Secondary | ICD-10-CM | POA: Diagnosis present

## 2023-10-26 ENCOUNTER — Encounter: Payer: Self-pay | Admitting: Internal Medicine

## 2023-10-26 LAB — CULTURE, URINE COMPREHENSIVE: RESULT:: NO GROWTH

## 2023-11-04 ENCOUNTER — Encounter: Payer: Self-pay | Admitting: Internal Medicine

## 2023-11-04 DIAGNOSIS — I700 Atherosclerosis of aorta: Secondary | ICD-10-CM | POA: Insufficient documentation

## 2024-04-25 ENCOUNTER — Other Ambulatory Visit: Payer: Self-pay | Admitting: Internal Medicine

## 2024-04-25 DIAGNOSIS — Z1231 Encounter for screening mammogram for malignant neoplasm of breast: Secondary | ICD-10-CM

## 2024-05-03 ENCOUNTER — Ambulatory Visit: Payer: Self-pay | Admitting: Internal Medicine

## 2024-05-03 ENCOUNTER — Other Ambulatory Visit (INDEPENDENT_AMBULATORY_CARE_PROVIDER_SITE_OTHER)

## 2024-05-03 DIAGNOSIS — E063 Autoimmune thyroiditis: Secondary | ICD-10-CM | POA: Diagnosis not present

## 2024-05-03 DIAGNOSIS — E782 Mixed hyperlipidemia: Secondary | ICD-10-CM

## 2024-05-03 DIAGNOSIS — I10 Essential (primary) hypertension: Secondary | ICD-10-CM

## 2024-05-03 DIAGNOSIS — Z136 Encounter for screening for cardiovascular disorders: Secondary | ICD-10-CM

## 2024-05-03 LAB — LIPID PANEL
Cholesterol: 234 mg/dL — ABNORMAL HIGH (ref 0–200)
HDL: 57.5 mg/dL (ref >39.00)
LDL Cholesterol: 156 mg/dL — ABNORMAL HIGH (ref 0–99)
NonHDL: 176.88
Total CHOL/HDL Ratio: 4
Triglycerides: 104 mg/dL (ref 0.0–149.0)
VLDL: 20.8 mg/dL (ref 0.0–40.0)

## 2024-05-03 LAB — CBC WITH DIFFERENTIAL/PLATELET
Basophils Absolute: 0 K/uL (ref 0.0–0.1)
Basophils Relative: 0.5 % (ref 0.0–3.0)
Eosinophils Absolute: 0.1 K/uL (ref 0.0–0.7)
Eosinophils Relative: 3.7 % (ref 0.0–5.0)
HCT: 40.4 % (ref 36.0–46.0)
Hemoglobin: 14.1 g/dL (ref 12.0–15.0)
Lymphocytes Relative: 32.8 % (ref 12.0–46.0)
Lymphs Abs: 1.3 K/uL (ref 0.7–4.0)
MCHC: 34.9 g/dL (ref 30.0–36.0)
MCV: 89.5 fl (ref 78.0–100.0)
Monocytes Absolute: 0.3 K/uL (ref 0.1–1.0)
Monocytes Relative: 8.7 % (ref 3.0–12.0)
Neutro Abs: 2.2 K/uL (ref 1.4–7.7)
Neutrophils Relative %: 54.3 % (ref 43.0–77.0)
Platelets: 198 K/uL (ref 150.0–400.0)
RBC: 4.51 Mil/uL (ref 3.87–5.11)
RDW: 13.4 % (ref 11.5–15.5)
WBC: 4 K/uL (ref 4.0–10.5)

## 2024-05-03 LAB — TSH: TSH: 2.44 u[IU]/mL (ref 0.35–5.50)

## 2024-05-03 LAB — COMPREHENSIVE METABOLIC PANEL WITH GFR
ALT: 18 U/L (ref 0–35)
AST: 19 U/L (ref 0–37)
Albumin: 4.3 g/dL (ref 3.5–5.2)
Alkaline Phosphatase: 37 U/L — ABNORMAL LOW (ref 39–117)
BUN: 16 mg/dL (ref 6–23)
CO2: 26 meq/L (ref 19–32)
Calcium: 9.4 mg/dL (ref 8.4–10.5)
Chloride: 99 meq/L (ref 96–112)
Creatinine, Ser: 0.76 mg/dL (ref 0.40–1.20)
GFR: 84.28 mL/min (ref >60.00)
Glucose, Bld: 102 mg/dL — ABNORMAL HIGH (ref 70–99)
Potassium: 3.7 meq/L (ref 3.5–5.1)
Sodium: 137 meq/L (ref 135–145)
Total Bilirubin: 0.6 mg/dL (ref 0.2–1.2)
Total Protein: 6.5 g/dL (ref 6.0–8.3)

## 2024-05-03 LAB — HEMOGLOBIN A1C: Hgb A1c MFr Bld: 5.2 % (ref 4.6–6.5)

## 2024-05-04 ENCOUNTER — Other Ambulatory Visit: Payer: Self-pay | Admitting: Medical Genetics

## 2024-05-08 NOTE — Telephone Encounter (Signed)
 The 10-year ASCVD risk score (Arnett DK, et al., 2019) is: 4.6%   Values used to calculate the score:     Age: 63 years     Clincally relevant sex: Female     Is Non-Hispanic African American: No     Diabetic: No     Tobacco smoker: No     Systolic Blood Pressure: 118 mmHg     Is BP treated: Yes     HDL Cholesterol: 57.5 mg/dL     Total Cholesterol: 234 mg/dL

## 2024-05-09 NOTE — Addendum Note (Signed)
 Addended by: GEOFM GLADE PARAS on: 05/09/2024 09:03 PM   Modules accepted: Orders

## 2024-05-17 ENCOUNTER — Encounter (INDEPENDENT_AMBULATORY_CARE_PROVIDER_SITE_OTHER): Payer: Self-pay

## 2024-05-23 ENCOUNTER — Other Ambulatory Visit

## 2024-05-23 DIAGNOSIS — Z006 Encounter for examination for normal comparison and control in clinical research program: Secondary | ICD-10-CM

## 2024-05-31 ENCOUNTER — Other Ambulatory Visit (HOSPITAL_BASED_OUTPATIENT_CLINIC_OR_DEPARTMENT_OTHER)

## 2024-06-01 LAB — GENECONNECT MOLECULAR SCREEN: Genetic Analysis Overall Interpretation: NEGATIVE

## 2024-06-06 ENCOUNTER — Ambulatory Visit (HOSPITAL_BASED_OUTPATIENT_CLINIC_OR_DEPARTMENT_OTHER)
Admission: RE | Admit: 2024-06-06 | Discharge: 2024-06-06 | Disposition: A | Discharge status: Home / Self Care | Payer: Self-pay | Source: Ambulatory Visit | Attending: Internal Medicine | Admitting: Internal Medicine

## 2024-06-06 DIAGNOSIS — Z136 Encounter for screening for cardiovascular disorders: Secondary | ICD-10-CM | POA: Insufficient documentation

## 2024-06-07 ENCOUNTER — Ambulatory Visit: Payer: Self-pay | Admitting: Internal Medicine

## 2024-06-11 ENCOUNTER — Ambulatory Visit
Admission: RE | Admit: 2024-06-11 | Discharge: 2024-06-11 | Disposition: A | Discharge status: Home / Self Care | Source: Ambulatory Visit | Attending: Internal Medicine | Admitting: Internal Medicine

## 2024-06-11 DIAGNOSIS — Z1231 Encounter for screening mammogram for malignant neoplasm of breast: Secondary | ICD-10-CM

## 2024-06-22 ENCOUNTER — Other Ambulatory Visit: Payer: Self-pay | Admitting: Internal Medicine

## 2024-07-15 ENCOUNTER — Encounter: Payer: Self-pay | Admitting: Internal Medicine

## 2024-07-15 NOTE — Patient Instructions (Addendum)
 Medications changes include :   None      Return in about 1 year (around 07/16/2025) for Physical Exam.    Health Maintenance, Female Adopting a healthy lifestyle and getting preventive care are important in promoting health and wellness. Ask your health care provider about: The right schedule for you to have regular tests and exams. Things you can do on your own to prevent diseases and keep yourself healthy. What should I know about diet, weight, and exercise? Eat a healthy diet  Eat a diet that includes plenty of vegetables, fruits, low-fat dairy products, and lean protein. Do not eat a lot of foods that are high in solid fats, added sugars, or sodium. Maintain a healthy weight Body mass index (BMI) is used to identify weight problems. It estimates body fat based on height and weight. Your health care provider can help determine your BMI and help you achieve or maintain a healthy weight. Get regular exercise Get regular exercise. This is one of the most important things you can do for your health. Most adults should: Exercise for at least 150 minutes each week. The exercise should increase your heart rate and make you sweat (moderate-intensity exercise). Do strengthening exercises at least twice a week. This is in addition to the moderate-intensity exercise. Spend less time sitting. Even light physical activity can be beneficial. Watch cholesterol and blood lipids Have your blood tested for lipids and cholesterol at 62 years of age, then have this test every 5 years. Have your cholesterol levels checked more often if: Your lipid or cholesterol levels are high. You are older than 62 years of age. You are at high risk for heart disease. What should I know about cancer screening? Depending on your health history and family history, you may need to have cancer screening at various ages. This may include screening for: Breast cancer. Cervical cancer. Colorectal cancer. Skin  cancer. Lung cancer. What should I know about heart disease, diabetes, and high blood pressure? Blood pressure and heart disease High blood pressure causes heart disease and increases the risk of stroke. This is more likely to develop in people who have high blood pressure readings or are overweight. Have your blood pressure checked: Every 3-5 years if you are 62-53 years of age. Every year if you are 62 years old or older. Diabetes Have regular diabetes screenings. This checks your fasting blood sugar level. Have the screening done: Once every three years after age 62 if you are at a normal weight and have a low risk for diabetes. More often and at a younger age if you are overweight or have a high risk for diabetes. What should I know about preventing infection? Hepatitis B If you have a higher risk for hepatitis B, you should be screened for this virus. Talk with your health care provider to find out if you are at risk for hepatitis B infection. Hepatitis C Testing is recommended for: Everyone born from [birth year] through 1965. Anyone with known risk factors for hepatitis C. Sexually transmitted infections (STIs) Get screened for STIs, including gonorrhea and chlamydia, if: You are sexually active and are younger than 62 years of age. You are older than 62 years of age and your health care provider tells you that you are at risk for this type of infection. Your sexual activity has changed since you were last screened, and you are at increased risk for chlamydia or gonorrhea. Ask your health care provider if you are  at risk. Ask your health care provider about whether you are at high risk for HIV. Your health care provider may recommend a prescription medicine to help prevent HIV infection. If you choose to take medicine to prevent HIV, you should first get tested for HIV. You should then be tested every 3 months for as long as you are taking the medicine. Pregnancy If you are about to stop  having your period (premenopausal) and you may become pregnant, seek counseling before you get pregnant. Take 400 to 800 micrograms (mcg) of folic acid every day if you become pregnant. Ask for birth control (contraception) if you want to prevent pregnancy. Osteoporosis and menopause Osteoporosis is a disease in which the bones lose minerals and strength with aging. This can result in bone fractures. If you are 62 years old or older, or if you are at risk for osteoporosis and fractures, ask your health care provider if you should: Be screened for bone loss. Take a calcium or vitamin D supplement to lower your risk of fractures. Be given hormone replacement therapy (HRT) to treat symptoms of menopause. Follow these instructions at home: Alcohol use Do not drink alcohol if: Your health care provider tells you not to drink. You are pregnant, may be pregnant, or are planning to become pregnant. If you drink alcohol: Limit how much you have to: 0-1 drink a day. Know how much alcohol is in your drink. In the [place] one drink equals one 12 oz bottle of beer (355 mL), one 5 oz glass of wine (148 mL), or one 1 oz glass of hard liquor (44 mL). Lifestyle Do not use any products that contain nicotine or tobacco. These products include cigarettes, chewing tobacco, and vaping devices, such as e-cigarettes. If you need help quitting, ask your health care provider. Do not use street drugs. Do not share needles. Ask your health care provider for help if you need support or information about quitting drugs. General instructions Schedule regular health, dental, and eye exams. Stay current with your vaccines. Tell your health care provider if: You often feel depressed. You have ever been abused or do not feel safe at home. Summary Adopting a healthy lifestyle and getting preventive care are important in promoting health and wellness. Follow your health care provider's instructions about healthy diet,  exercising, and getting tested or screened for diseases. Follow your health care provider's instructions on monitoring your cholesterol and blood pressure. This information is not intended to replace advice given to you by your health care provider. Make sure you discuss any questions you have with your health care provider. Document Revised: 02/02/2021 Document Reviewed: 02/02/2021 Elsevier Patient Education  2024 [employer].

## 2024-07-15 NOTE — Progress Notes (Unsigned)
 Subjective:    Patient ID: Caroline Wall, female    DOB: 1960/11/27, 63 y.o.   MRN: [id number]      HPI Caroline Wall is here for a Physical exam and her chronic medical problems.    Takes melatonin nightly -still does not sleep great.    Doing some nature therapy.    Since end of Sept she has had 3 work trips - has gained weight.  Exercises at 5:30-6:30 - light weights and cardio and hikes daily at least 30 min 7 days a week.  She does not think she does very good at keeping her portion small   Medications and allergies reviewed with patient and updated if appropriate.  Current Outpatient Medications on File Prior to Visit  Medication Sig Dispense Refill   Ascorbic Acid (VITAMIN C) 1000 MG tablet Take 1,000 mg by mouth daily.     Cholecalciferol (VITAMIN D) 10 MCG/ML LIQD      fluorouracil (EFUDEX) 5 % cream Apply 1 Application topically 2 (two) times daily.     hydrochlorothiazide  (HYDRODIURIL ) 25 MG tablet TAKE 1 TABLET DAILY 90 tablet 3   levothyroxine  (SYNTHROID ) 88 MCG tablet Take 1 tablet (88 mcg total) by mouth daily. 90 tablet 3   Multiple Vitamin (MULTIVITAMIN) tablet Take 1 tablet by mouth daily.     Multiple Vitamins-Minerals (ONE DAILY CALCIUM/IRON) TABS Take by mouth.     Omega-3 Fatty Acids (FISH OIL) 1000 MG CAPS Take by mouth daily.     Turmeric 500 MG CAPS Take by mouth.     No current facility-administered medications on file prior to visit.    Review of Systems  Constitutional:  Negative for fever.  Eyes:  Negative for visual disturbance.  Respiratory:  Negative for cough, shortness of breath and wheezing.   Cardiovascular:  Positive for leg swelling (end of day - mild). Negative for chest pain and palpitations.  Gastrointestinal:  Positive for constipation (colace qod). Negative for abdominal pain, blood in stool and diarrhea.       No gerd  Genitourinary:  Negative for dysuria.  Musculoskeletal:  Negative for arthralgias and back pain.  Skin:  Negative  for rash.  Neurological:  Negative for light-headedness and headaches.  Psychiatric/Behavioral:  Negative for dysphoric mood. The patient is nervous/anxious (well managed).        Objective:   Vitals:   07/16/24 0913  BP: 126/80  Pulse: 61  Temp: 98.2 F (36.8 C)  SpO2: 96%   Filed Weights   07/16/24 0913  Weight: 205 lb (93 kg)   Body mass index is 30.72 kg/m.  BP Readings from Last 3 Encounters:  07/16/24 126/80  10/24/23 118/80  07/15/23 128/78    Wt Readings from Last 3 Encounters:  07/16/24 205 lb (93 kg)  10/24/23 204 lb (92.5 kg)  07/15/23 198 lb (89.8 kg)       Physical Exam Constitutional: She appears well-developed and well-nourished. No distress.  HENT:  Head: Normocephalic and atraumatic.  Right Ear: External ear normal. Normal ear canal and TM Left Ear: External ear normal.  Normal ear canal and TM Mouth/Throat: Oropharynx is clear and moist.  Eyes: Conjunctivae normal.  Neck: Neck supple. No tracheal deviation present. No thyromegaly present.  No carotid bruit  Cardiovascular: Normal rate, regular rhythm and normal heart sounds.   No murmur heard.  No edema. Pulmonary/Chest: Effort normal and breath sounds normal. No respiratory distress. She has no wheezes. She has no rales.  Breast: deferred  Abdominal: Soft. She exhibits no distension. There is no tenderness.  Lymphadenopathy: She has no cervical adenopathy.  Skin: Skin is warm and dry. She is not diaphoretic.  Psychiatric: She has a normal mood and affect. Her behavior is normal.     Lab Results  Component Value Date   WBC 4.0 05/03/2024   HGB 14.1 05/03/2024   HCT 40.4 05/03/2024   PLT 198.0 05/03/2024   GLUCOSE 102 (H) 05/03/2024   CHOL 234 (H) 05/03/2024   TRIG 104.0 05/03/2024   HDL 57.50 05/03/2024   LDLDIRECT 145.0 02/26/2019   LDLCALC 156 (H) 05/03/2024   ALT 18 05/03/2024   AST 19 05/03/2024   NA 137 05/03/2024   K 3.7 05/03/2024   CL 99 05/03/2024   CREATININE 0.76  05/03/2024   BUN 16 05/03/2024   CO2 26 05/03/2024   TSH 2.44 05/03/2024   HGBA1C 5.2 05/03/2024    The 10-year ASCVD risk score (Arnett DK, et al., 2019) is: 5.7%   Values used to calculate the score:     Age: 3 years     Clincally relevant sex: Female     Is Non-Hispanic African American: No     Diabetic: No     Tobacco smoker: No     Systolic Blood Pressure: 126 mmHg     Is BP treated: Yes     HDL Cholesterol: 57.5 mg/dL     Total Cholesterol: 234 mg/dL      Assessment & Plan:   Physical exam: Screening blood work  ordered Exercise  regular  Weight  obese - working on weight loss Substance abuse  none   Reviewed recommended immunizations.  Prevnar 20 given today   Health Maintenance  Topic Date Due   Cervical Cancer Screening (HPV/Pap Cotest)  12/26/2016   COVID-19 Vaccine (5 - 2025-26 season) 05/28/2024   Influenza Vaccine  12/25/2024 (Originally 04/27/2024)   Pneumococcal Vaccine: 50+ Years (2 of 2 - PCV20 or PCV21) 07/16/2025 (Originally 12/30/2015)   Colonoscopy  09/15/2025   DTaP/Tdap/Td (3 - Td or Tdap) 02/15/2026   Mammogram  06/11/2026   Hepatitis C Screening  Completed   HIV Screening  Completed   Zoster Vaccines- Shingrix   Completed   Hepatitis B Vaccines 19-59 Average Risk  Aged Out   HPV VACCINES  Aged Out   Meningococcal B Vaccine  Aged Out          See Problem List for Assessment and Plan of chronic medical problems.

## 2024-07-16 ENCOUNTER — Ambulatory Visit (INDEPENDENT_AMBULATORY_CARE_PROVIDER_SITE_OTHER): Payer: 59 | Admitting: Internal Medicine

## 2024-07-16 ENCOUNTER — Encounter: Payer: Self-pay | Admitting: Internal Medicine

## 2024-07-16 VITALS — BP 126/80 | HR 61 | Temp 98.2°F | Ht 68.5 in | Wt 205.0 lb

## 2024-07-16 DIAGNOSIS — Z0000 Encounter for general adult medical examination without abnormal findings: Secondary | ICD-10-CM

## 2024-07-16 DIAGNOSIS — E063 Autoimmune thyroiditis: Secondary | ICD-10-CM

## 2024-07-16 DIAGNOSIS — E7800 Pure hypercholesterolemia, unspecified: Secondary | ICD-10-CM

## 2024-07-16 DIAGNOSIS — I10 Essential (primary) hypertension: Secondary | ICD-10-CM

## 2024-07-16 DIAGNOSIS — Z85820 Personal history of malignant melanoma of skin: Secondary | ICD-10-CM

## 2024-07-16 DIAGNOSIS — Z23 Encounter for immunization: Secondary | ICD-10-CM

## 2024-07-16 DIAGNOSIS — E66811 Obesity, class 1: Secondary | ICD-10-CM

## 2024-07-16 MED ORDER — HYDROCHLOROTHIAZIDE 25 MG PO TABS: 25.0000 mg | ORAL_TABLET | Freq: Every day | ORAL | 3 refills | Status: AC

## 2024-07-16 MED ORDER — LEVOTHYROXINE SODIUM 88 MCG PO TABS: 88.0000 ug | ORAL_TABLET | Freq: Every day | ORAL | 3 refills | Status: AC

## 2024-07-16 NOTE — Assessment & Plan Note (Signed)
 Chronic BMI 30.72 She is exercising regularly and will continue with that She needs to work on limiting her portions She also has several work trips that make it difficult for her to control portions in which she is eating Will work on portion control and eliminating think she should not be eating

## 2024-07-16 NOTE — Assessment & Plan Note (Signed)
 History of malignant melanoma and basal cell carcinoma Following with [facility] dermatology annually

## 2024-07-16 NOTE — Assessment & Plan Note (Signed)
Chronic Blood pressure well controlled CMP, cbc reviewed Continue HCTZ 25 mg daily

## 2024-07-16 NOTE — Assessment & Plan Note (Signed)
 Chronic Lab Results  Component Value Date   LDLCALC 156 (H) 05/03/2024   LDL higher than ideal - will continue to work on diet Ascvd risk low Continue lifestyle control Continue healthy diet, regular exercise

## 2024-07-16 NOTE — Assessment & Plan Note (Signed)
 Chronic  Clinically euthyroid TSH in normal range  Lab Results  Component Value Date   TSH 2.44 05/03/2024   Continue levothyroxine  to 88 mcg daily

## 2024-09-09 ENCOUNTER — Encounter: Payer: Self-pay | Admitting: Internal Medicine

## 2024-09-11 ENCOUNTER — Ambulatory Visit: Admitting: Family Medicine

## 2024-09-12 ENCOUNTER — Ambulatory Visit: Admitting: Family Medicine

## 2024-09-12 ENCOUNTER — Encounter: Payer: Self-pay | Admitting: Family Medicine

## 2024-09-12 ENCOUNTER — Ambulatory Visit: Admitting: Internal Medicine

## 2024-09-12 VITALS — BP 132/90 | HR 81 | Temp 97.6°F | Ht 68.5 in | Wt 207.2 lb

## 2024-09-12 DIAGNOSIS — M533 Sacrococcygeal disorders, not elsewhere classified: Secondary | ICD-10-CM

## 2024-09-12 DIAGNOSIS — M5442 Lumbago with sciatica, left side: Secondary | ICD-10-CM

## 2024-09-12 NOTE — Progress Notes (Signed)
 Established Patient Office Visit   Subjective  Patient ID: Caroline Wall, female    DOB: 09-01-1961  Age: 63 y.o. MRN: [id number]  Chief Complaint  Patient presents with   Acute Visit    Patient came in for lower back pain, pain 3/10, was wrapping gifts, aching and sharp pain, started 11/7.    Patient is a 63 year old female followed by Glade Das, MD and seen for acute concern.  Patient endorses b/l low back pain with radiation into LLE starting 12/7 after standing up wrapping gifts on a low bed.  Feeling is similar to that of prior back pain.  Symptoms improving.  Worse in am.  Had a massage yesterday which helped.  Also tried stretching, ibuprofen, bareback, Epsom salt, heat, and Arnica gel.  Lidocaine patch caused skin irritation.  Denies spasms, loss of bowel or bladder, paresthesias.      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/2017   Hypercholesteremia 03/13/2016   Palpitations 02/16/2016   History of basal cell carcinoma (BCC) 12/31/2014   Personal history of malignant melanoma 09/03/2013   Hypertension    Past Medical History:  Diagnosis Date   BCC (basal cell carcinoma), leg 11/2014   following at [facility] derm for same   Depression    prior tx failure with sertraline and wellbutrin   Depression    Elevated TSH 02/15/2017   GERD (gastroesophageal reflux disease)    in past   Heart murmur    Hypertension    Melanoma (HCC) 1996   L low back - follows annually for body scan at [facility]   Thyroid  disease 2018   Past Surgical History:   Procedure Laterality Date   ABDOMINAL HYSTERECTOMY  2007   FOOT SURGERY     left   SKIN CANCER EXCISION     melanoma 1996, BCC 11/2014   TONSILLECTOMY     Social History[1] Family History  Problem Relation Age of Onset   Endometrial cancer Mother 54   Hypertension Father    Clotting disorder Father    Atrial fibrillation Father    Asthma Father    Diabetes Paternal Grandmother    Heart disease Paternal Grandmother 20   Colon cancer Neg Hx    Esophageal cancer Neg Hx    Rectal cancer Neg Hx    Stomach cancer Neg Hx    Allergies[2]  ROS Negative unless stated above    Objective:     BP (!) 132/90 (BP Location: Left Arm, Patient Position: Sitting, Cuff Size: Large)   Pulse 81   Temp 97.6 F (36.4 C) (Oral)   Ht 5' 8.5 (1.74 m)   Wt 207 lb 3.2 oz (94 kg)   SpO2 99%   BMI 31.05 kg/m  BP Readings from Last 3 Encounters:  09/12/24 (!) 132/90  07/16/24 126/80  10/24/23 118/80   Wt Readings from Last 3 Encounters:  09/12/24 207 lb 3.2 oz (94 kg)  07/16/24 205 lb (93 kg)  10/24/23 204 lb (92.5 kg)      Physical Exam Constitutional:      General: She  is not in acute distress.    Appearance: Normal appearance.  HENT:     Head: Normocephalic and atraumatic.     Nose: Nose normal.     Mouth/Throat:     Mouth: Mucous membranes are moist.  Cardiovascular:     Rate and Rhythm: Normal rate and regular rhythm.     Heart sounds: Normal heart sounds. No murmur heard.    No gallop.  Pulmonary:     Effort: Pulmonary effort is normal. No respiratory distress.     Breath sounds: Normal breath sounds. No wheezing, rhonchi or rales.  Musculoskeletal:     Cervical back: Normal.     Thoracic back: Normal.     Lumbar back: Normal.       Back:     Comments: TTP L SI jt.  TTP of L sciatic nerve.  Skin:    General: Skin is warm and dry.  Neurological:     Mental Status: She is alert and oriented to person, place, and time.        07/16/2024    9:22 AM 07/15/2023     9:46 AM 07/09/2021   12:30 PM  Depression screen PHQ 2/9  Decreased Interest 0 0 0  Down, Depressed, Hopeless 0 0 0  PHQ - 2 Score 0 0 0  Altered sleeping   0  Tired, decreased energy   0  Change in appetite   0  Feeling bad or failure about yourself    0  Trouble concentrating   0  Moving slowly or fidgety/restless   0  Suicidal thoughts   0  PHQ-9 Score   0      Data saved with a previous flowsheet row definition      07/09/2021   12:30 PM  GAD 7 : Generalized Anxiety Score  Nervous, Anxious, on Edge 0  Control/stop worrying 0  Worry too much - different things 0  Trouble relaxing 0  Restless 0  Easily annoyed or irritable 0  Afraid - awful might happen 0  Total GAD 7 Score 0     No results found for any visits on 09/12/24.    Assessment & Plan:   Sacroiliac joint dysfunction of left side  Acute bilateral low back pain with left-sided sciatica  Acute low back pain and SI jt pain improving.  Continue supportive care including NSAIDs, stretching, massage, topical analgesics, etc. F/u with pcp prn.  Return if symptoms worsen or fail to improve.   Clotilda JONELLE Single, MD     [1]  Social History Tobacco Use   Smoking status: Former    Current packs/day: 0.00    Types: Cigarettes    Quit date: 09/27/1989    Years since quitting: 34.9   Smokeless tobacco: Never  Substance Use Topics   Alcohol use: Yes    Comment: diet tonic and gin - decreased intake   Drug use: No  [2]  Allergies Allergen Reactions   Codeine Rash

## 2025-07-17 ENCOUNTER — Encounter: Admitting: Internal Medicine
